# Patient Record
Sex: Male | Born: 1990 | Race: Black or African American | Hispanic: No | Marital: Single | Smoking: Former smoker
Health system: Southern US, Community
[De-identification: ages and names within clinical notes are randomized; demographics above are authoritative.]

## PROBLEM LIST (undated history)

## (undated) HISTORY — PX: CYST REMOVAL TRUNK: SHX6283

---
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
## 2013-01-13 ENCOUNTER — Encounter (HOSPITAL_COMMUNITY): Payer: Self-pay | Admitting: Emergency Medicine

## 2013-01-13 ENCOUNTER — Emergency Department (INDEPENDENT_AMBULATORY_CARE_PROVIDER_SITE_OTHER)
Admission: EM | Admit: 2013-01-13 | Discharge: 2013-01-13 | Disposition: A | Discharge status: Home / Self Care | Payer: Self-pay | Source: Home / Self Care | Attending: Family Medicine | Admitting: Family Medicine

## 2013-01-13 DIAGNOSIS — L02419 Cutaneous abscess of limb, unspecified: Secondary | ICD-10-CM

## 2013-01-13 DIAGNOSIS — L02416 Cutaneous abscess of left lower limb: Secondary | ICD-10-CM

## 2013-01-13 MED ORDER — SULFAMETHOXAZOLE-TRIMETHOPRIM 800-160 MG PO TABS: 1.0000 | ORAL_TABLET | Freq: Two times a day (BID) | ORAL | Status: DC

## 2013-01-13 NOTE — ED Notes (Signed)
C/o boil for two days.  No treatment done.

## 2013-01-13 NOTE — ED Provider Notes (Signed)
Mark Norton is a 22 y.o. male who presents to Urgent Care today for abscess left inner thigh present for several days worsening. He has not tried any medications or treatments. He feels well otherwise. It is painful and tender but not yet producing discharge. He feels well otherwise. He has not had anything like this before.    History reviewed. No pertinent past medical history. History  Substance Use Topics  . Smoking status: Not on file  . Smokeless tobacco: Not on file  . Alcohol Use: Not on file   ROS as above Medications reviewed. No current facility-administered medications for this encounter.   Current Outpatient Prescriptions  Medication Sig Dispense Refill  . ibuprofen (ADVIL,MOTRIN) 400 MG tablet Take 400 mg by mouth every 4 (four) hours as needed. For pain       . sulfamethoxazole-trimethoprim (SEPTRA DS) 800-160 MG per tablet Take 1 tablet by mouth 2 (two) times daily.  14 tablet  0  . [DISCONTINUED] fluticasone (FLONASE) 50 MCG/ACT nasal spray 1 spray daily. In each nostril         Exam:  BP 104/68  Pulse 64  Temp(Src) 97.5 F (36.4 C) (Oral)  Resp 18  SpO2 95% Gen: Well NAD SKIN: Area of fluctuance approximately 1 cm in circumference round by 2 x 3 cm area of induration and erythema on left inner thigh. Tender to touch  Abscess incision and drainage: Consent obtained and timeout performed. Area cleaned with alcohol and cold spray applied in 3 mL of lidocaine with epinephrine were used to achieve anesthesia. An 11 blade scalpel was used to incise the area of fluctuance and a moderate amount of pus was expressed and cultured.  A wooden Q-tip was used to break up loculations. The wound was packed with 2 inches of quarter inch packing material. Patient tolerated the procedure well.  Assessment and Plan: 22 y.o. male with abscess incision and drainage. Culture pending. Place patient on empiric antibiotics will use Septra. Followup as needed Remove packing  material in 3 days Discussed warning signs or symptoms. Please see discharge instructions. Patient expresses understanding.      Rodolph Bong, MD 01/13/13 1236

## 2013-01-14 ENCOUNTER — Encounter (HOSPITAL_COMMUNITY): Payer: Self-pay

## 2013-01-14 ENCOUNTER — Emergency Department (HOSPITAL_COMMUNITY)
Admission: EM | Admit: 2013-01-14 | Discharge: 2013-01-14 | Disposition: A | Discharge status: Home / Self Care | Payer: Self-pay | Attending: Emergency Medicine | Admitting: Emergency Medicine

## 2013-01-14 DIAGNOSIS — M549 Dorsalgia, unspecified: Secondary | ICD-10-CM | POA: Insufficient documentation

## 2013-01-14 DIAGNOSIS — L0291 Cutaneous abscess, unspecified: Secondary | ICD-10-CM

## 2013-01-14 DIAGNOSIS — R110 Nausea: Secondary | ICD-10-CM | POA: Insufficient documentation

## 2013-01-14 DIAGNOSIS — F17200 Nicotine dependence, unspecified, uncomplicated: Secondary | ICD-10-CM | POA: Insufficient documentation

## 2013-01-14 DIAGNOSIS — R51 Headache: Secondary | ICD-10-CM | POA: Insufficient documentation

## 2013-01-14 DIAGNOSIS — L02419 Cutaneous abscess of limb, unspecified: Secondary | ICD-10-CM | POA: Insufficient documentation

## 2013-01-14 MED ORDER — SULFAMETHOXAZOLE-TMP DS 800-160 MG PO TABS
2.0000 | ORAL_TABLET | Freq: Once | ORAL | Status: AC
  Administered 2013-01-14: 2 via ORAL
  Filled 2013-01-14: qty 2

## 2013-01-14 MED ORDER — CEPHALEXIN 250 MG PO CAPS
500.0000 mg | ORAL_CAPSULE | Freq: Once | ORAL | Status: AC
  Administered 2013-01-14: 500 mg via ORAL
  Filled 2013-01-14: qty 2

## 2013-01-14 MED ORDER — CEPHALEXIN 500 MG PO CAPS: 500.0000 mg | ORAL_CAPSULE | Freq: Four times a day (QID) | ORAL | Status: DC

## 2013-01-14 MED ORDER — IBUPROFEN 800 MG PO TABS: 800.0000 mg | ORAL_TABLET | Freq: Three times a day (TID) | ORAL | Status: DC | PRN

## 2013-01-14 MED ORDER — HYDROCODONE-ACETAMINOPHEN 5-325 MG PO TABS: 1.0000 | ORAL_TABLET | ORAL | Status: DC | PRN

## 2013-01-14 MED ORDER — HYDROCODONE-ACETAMINOPHEN 5-325 MG PO TABS
1.0000 | ORAL_TABLET | Freq: Once | ORAL | Status: AC
  Administered 2013-01-14: 1 via ORAL
  Filled 2013-01-14: qty 1

## 2013-01-14 MED ORDER — SULFAMETHOXAZOLE-TRIMETHOPRIM 800-160 MG PO TABS: 2.0000 | ORAL_TABLET | Freq: Two times a day (BID) | ORAL | Status: AC

## 2013-01-14 NOTE — ED Notes (Signed)
Pt presents with Left upper thigh, pt reports he was seen at [facility] yesterday, they lanced the area and packed it. Pt reports the packing has came out since then, area is bleeding and he is having increased pain.

## 2013-01-14 NOTE — ED Provider Notes (Signed)
Medical screening examination/treatment/procedure(s) were performed by non-physician practitioner and as supervising physician I was immediately available for consultation/collaboration.  Layla Maw Ward, DO 01/14/13 2337

## 2013-01-14 NOTE — ED Provider Notes (Signed)
CSN: [id number]     Arrival date & time 01/14/13  1732 History  This chart was scribed for Trixie Dredge, PA working with Raelyn Number, DO by Quintella Reichert, ED Scribe. This patient was seen in room TR08C/TR08C and the patient's care was started at 6:34 PM.  Chief Complaint  Patient presents with  . Abscess    The history is provided by the patient. No language interpreter was used.    HPI Comments: Mark Norton is a 21 y.o. male who presents to the Emergency Department complaining of a gradually-worsening, moderately-painful, non-draining abscess to the left upper thigh that has been present for 3 days.  Pain is exacerbated by sitting and walking.  Pt was seen at [facility] yesterday and had the area lanced and received a Septra prescription.  Today while he was cleaning the area some of his packing came out and the area began bleeding.  His pain to that area also became much more severe.  He has not taken any pain medications pta.  He states that he had some nausea yesterday after taking his antibiotics.  He also complains of back pain and headache that began today.  He denies fever, vomiting, diarrhea, testicular pain or swelling, or dysuria.   History reviewed. No pertinent past medical history.  History reviewed. No pertinent past surgical history.  History reviewed. No pertinent family history.  History  Substance Use Topics  . Smoking status: Current Every Day Smoker    Types: Cigarettes  . Smokeless tobacco: Not on file  . Alcohol Use: Yes     Review of Systems  Constitutional: Negative for fever.  Gastrointestinal: Positive for nausea (with antibiotics). Negative for vomiting and diarrhea.  Genitourinary: Negative for dysuria, scrotal swelling, difficulty urinating and testicular pain.  Musculoskeletal: Positive for back pain.  Skin:       Abscess  Neurological: Positive for headaches.     Allergies  Blueberry flavor  Home Medications   Current Outpatient Rx  Name   Route  Sig  Dispense  Refill  . sulfamethoxazole-trimethoprim (SEPTRA DS) 800-160 MG per tablet   Oral   Take 1 tablet by mouth 2 (two) times daily.   14 tablet   0    BP 98/60  Pulse 78  Temp(Src) 98.2 F (36.8 C) (Oral)  Resp 16  Ht 5\' 9"  (1.753 m)  Wt 157 lb 4.8 oz (71.351 kg)  BMI 23.22 kg/m2  SpO2 98%  Physical Exam  Nursing note and vitals reviewed. Constitutional: He appears well-developed and well-nourished. No distress.  HENT:  Head: Normocephalic and atraumatic.  Neck: Neck supple.  Pulmonary/Chest: Effort normal.  Abdominal: Soft. There is no tenderness. There is no rebound and no guarding.  Neurological: He is alert.  Skin: He is not diaphoretic. There is erythema.  Left superior medial thigh with area of induration, with central incision that is open with packing material in place.  Small area of erythema over the indurated area.  No active discharge.  Tender to palpation.    ED Course  Procedures (including critical care time)  DIAGNOSTIC STUDIES: Oxygen Saturation is 98% on room air, normal by my interpretation.    COORDINATION OF CARE: 6:39 PM-Discussed treatment plan which includes pain medication, continuing antibiotics treatment, and warm compresses with pt at bedside and pt agreed to plan.    Labs Review Labs Reviewed - No data to display  Imaging Review No results found.  MDM   1. Abscess    Pt  with abscess with localized erythema, seen at Urgent Care yesterday with I&D performed.  Packing is still in place, the area is not worse in appearance per patient.  He has no systemic symptoms.  He has only taken 1-2 doses of his bactrim.  No pain medication prescribed to him at the time, per patient.  Pt d/c home with increased dosage of bactrim per EMRA guidelines, +Keflex, + pain medication (norco and ibuprofen).  Pt to follow prior UC instructions regarding removing packing.  Pt using crutches from home, requests crutches from [place] for comfort given  pain with walking due to the area that is affected. Discussed findings, treatment, and follow up  with patient.  Pt given return precautions.  Pt verbalizes understanding and agrees with plan.        I personally performed the services described in this documentation, which was scribed in my presence. The recorded information has been reviewed and is accurate.    Trixie Dredge, PA-C 01/14/13 1911

## 2013-01-17 LAB — CULTURE, ROUTINE-ABSCESS

## 2013-01-17 NOTE — ED Notes (Signed)
No group B step or group staph Aureus

## 2013-01-17 NOTE — ED Notes (Signed)
[facility] lab review 

## 2013-10-23 ENCOUNTER — Encounter (HOSPITAL_COMMUNITY): Payer: Self-pay | Admitting: Emergency Medicine

## 2013-10-23 ENCOUNTER — Emergency Department (HOSPITAL_COMMUNITY): Payer: Self-pay

## 2013-10-23 ENCOUNTER — Emergency Department (HOSPITAL_COMMUNITY)
Admission: EM | Admit: 2013-10-23 | Discharge: 2013-10-23 | Disposition: A | Discharge status: Home / Self Care | Payer: Self-pay | Attending: Emergency Medicine | Admitting: Emergency Medicine

## 2013-10-23 DIAGNOSIS — Z23 Encounter for immunization: Secondary | ICD-10-CM | POA: Insufficient documentation

## 2013-10-23 DIAGNOSIS — R079 Chest pain, unspecified: Secondary | ICD-10-CM | POA: Insufficient documentation

## 2013-10-23 DIAGNOSIS — IMO0002 Reserved for concepts with insufficient information to code with codable children: Secondary | ICD-10-CM | POA: Insufficient documentation

## 2013-10-23 DIAGNOSIS — M79601 Pain in right arm: Secondary | ICD-10-CM

## 2013-10-23 DIAGNOSIS — T07XXXA Unspecified multiple injuries, initial encounter: Secondary | ICD-10-CM

## 2013-10-23 DIAGNOSIS — S0990XA Unspecified injury of head, initial encounter: Secondary | ICD-10-CM | POA: Insufficient documentation

## 2013-10-23 DIAGNOSIS — Y9389 Activity, other specified: Secondary | ICD-10-CM | POA: Insufficient documentation

## 2013-10-23 DIAGNOSIS — F17200 Nicotine dependence, unspecified, uncomplicated: Secondary | ICD-10-CM | POA: Insufficient documentation

## 2013-10-23 DIAGNOSIS — Y92410 Unspecified street and highway as the place of occurrence of the external cause: Secondary | ICD-10-CM | POA: Insufficient documentation

## 2013-10-23 MED ORDER — ORPHENADRINE CITRATE ER 100 MG PO TB12: 100.0000 mg | ORAL_TABLET | Freq: Two times a day (BID) | ORAL | Status: DC | PRN

## 2013-10-23 MED ORDER — TETANUS-DIPHTH-ACELL PERTUSSIS 5-2.5-18.5 LF-MCG/0.5 IM SUSP
0.5000 mL | Freq: Once | INTRAMUSCULAR | Status: AC
  Administered 2013-10-23: 0.5 mL via INTRAMUSCULAR
  Filled 2013-10-23: qty 0.5

## 2013-10-23 MED ORDER — HYDROCODONE-ACETAMINOPHEN 5-325 MG PO TABS
1.0000 | ORAL_TABLET | Freq: Once | ORAL | Status: AC
  Administered 2013-10-23: 1 via ORAL
  Filled 2013-10-23: qty 1

## 2013-10-23 NOTE — ED Notes (Signed)
Dr. Benito MccreedyMcLendon at the bedside talking with pt and pt's mother.
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
## 2013-10-23 NOTE — ED Notes (Signed)
Patient transported to X-ray, then CT 

## 2013-10-23 NOTE — ED Notes (Signed)
Mom at bedside.
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
## 2013-10-24 NOTE — ED Provider Notes (Signed)
I saw and evaluated the patient, reviewed the resident's note and I agree with the findings and plan.   EKG Interpretation   Date/Time:    Ventricular Rate:  79 PR Interval:  143 QRS Duration: 84 QT Interval:  356 QTC Calculation: 408 R Axis:   85 Text Interpretation:          Rolan BuccoMelanie Helios Kohlmann, MD 10/24/13 [phone]

## 2013-10-31 ENCOUNTER — Emergency Department (HOSPITAL_COMMUNITY): Payer: Self-pay

## 2013-10-31 ENCOUNTER — Encounter (HOSPITAL_COMMUNITY): Payer: Self-pay | Admitting: Emergency Medicine

## 2013-10-31 ENCOUNTER — Emergency Department (HOSPITAL_COMMUNITY)
Admission: EM | Admit: 2013-10-31 | Discharge: 2013-10-31 | Disposition: A | Discharge status: Home / Self Care | Payer: Self-pay | Attending: Emergency Medicine | Admitting: Emergency Medicine

## 2013-10-31 DIAGNOSIS — S1190XA Unspecified open wound of unspecified part of neck, initial encounter: Secondary | ICD-10-CM | POA: Insufficient documentation

## 2013-10-31 DIAGNOSIS — S21209A Unspecified open wound of unspecified back wall of thorax without penetration into thoracic cavity, initial encounter: Secondary | ICD-10-CM | POA: Insufficient documentation

## 2013-10-31 DIAGNOSIS — S0990XA Unspecified injury of head, initial encounter: Secondary | ICD-10-CM | POA: Insufficient documentation

## 2013-10-31 DIAGNOSIS — IMO0002 Reserved for concepts with insufficient information to code with codable children: Secondary | ICD-10-CM

## 2013-10-31 DIAGNOSIS — F17200 Nicotine dependence, unspecified, uncomplicated: Secondary | ICD-10-CM | POA: Insufficient documentation

## 2013-10-31 MED ORDER — ONDANSETRON HCL 4 MG/2ML IJ SOLN
4.0000 mg | Freq: Once | INTRAMUSCULAR | Status: AC
  Administered 2013-10-31: 4 mg via INTRAVENOUS
  Filled 2013-10-31: qty 2

## 2013-10-31 MED ORDER — HYDROMORPHONE HCL PF 1 MG/ML IJ SOLN
1.0000 mg | Freq: Once | INTRAMUSCULAR | Status: AC
  Administered 2013-10-31: 1 mg via INTRAVENOUS
  Filled 2013-10-31: qty 1

## 2013-10-31 NOTE — ED Notes (Signed)
Dr. Rancour at the bedside.  
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
## 2013-10-31 NOTE — ED Notes (Signed)
Pt getting dressed. Pt standing at bedside. Pt received dressings for staples to back of neck and back of shoulder.
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
## 2013-11-07 ENCOUNTER — Emergency Department (INDEPENDENT_AMBULATORY_CARE_PROVIDER_SITE_OTHER)
Admission: EM | Admit: 2013-11-07 | Discharge: 2013-11-07 | Disposition: A | Discharge status: Home / Self Care | Payer: Self-pay | Source: Home / Self Care

## 2013-11-07 ENCOUNTER — Encounter (HOSPITAL_COMMUNITY): Payer: Self-pay | Admitting: Emergency Medicine

## 2013-11-07 DIAGNOSIS — Z4802 Encounter for removal of sutures: Secondary | ICD-10-CM

## 2013-11-07 NOTE — ED Notes (Signed)
Pt here for staple from right side of neck and back.  Wounds appears well healed no signs of infection.

## 2013-11-07 NOTE — Discharge Instructions (Signed)
Staple Removal, Care After °The staples that were used to close your skin have been removed. The care described here will need to continue until the wound is completely healed and your health care provider confirms that wound care can be stopped. °HOME CARE INSTRUCTIONS  °· Keep the wound site dry and clean. Do not soak it in water. °· If skin adhesive strips were applied after the staples were removed, they will begin to peel off in a few days. Allow them to remain in place until they fall off on their own. °· If you still have a bandage (dressing), change it at least once a day or as directed by your health care provider. If the dressing sticks, pour warm, sterile water over it until it loosens and can be removed without pulling apart the wound edges. Pat dry with a clean towel. °· Apply cream or ointment that stops the growth of bacteria (antibacterial cream or ointment) only if your health care provider has directed you to do so. Place a nonstick bandage over the wound to prevent the dressing from sticking. °· Cover the nonstick bandage with a new dressing as directed by your health care provider. °· If the bandage becomes wet, dirty, or develops a bad smell, change it as soon as possible. °· New scars become sunburned easily. Use sunscreens with a sun protection factor (SPF) of at least 15 when out in the sun. Reapply the SPF every 2 hours. °· Only take medicines as directed by your health care provider. °SEEK IMMEDIATE MEDICAL CARE IF:  °· You have redness, swelling, or increasing pain in the wound. °· You have pus coming from the wound. °· You have a fever. °· You notice a bad smell coming from the wound or dressing. °· Your wound edges open up after staples have been removed. °MAKE SURE YOU:  °· Understand these instructions. °· Will watch your condition. °· Will get help right away if you are not doing well or get worse. °Document Released: 03/27/2008 Document Revised: 04/19/2013 Document Reviewed:  03/27/2008 °[employer]® Patient Information ©2015 [employer]. This information is not intended to replace advice given to you by your health care provider. Make sure you discuss any questions you have with your health care provider. ° °

## 2013-11-07 NOTE — ED Provider Notes (Signed)
CSN: [id number]     Arrival date & time 11/07/13  1104 History   First MD Initiated Contact with Patient 11/07/13 1131     Chief Complaint  Patient presents with  . Suture / Staple Removal   (Consider location/radiation/quality/duration/timing/severity/associated sxs/prior Treatment) HPI Comments: For staple removal after repair laceration to posterior neck and R back.  6 staples R back and 5 post neck.   History reviewed. No pertinent past medical history. History reviewed. No pertinent past surgical history. History reviewed. No pertinent family history. History  Substance Use Topics  . Smoking status: Current Every Day Smoker    Types: Cigarettes  . Smokeless tobacco: Not on file  . Alcohol Use: Yes    Review of Systems  All other systems reviewed and are negative.   Allergies  Blueberry flavor  Home Medications   Prior to Admission medications   Not on File   BP 106/64  Pulse 74  Temp(Src) 98.3 F (36.8 C) (Oral)  Resp 14  SpO2 98% Physical Exam  Nursing note and vitals reviewed. Constitutional: He is oriented to person, place, and time. He appears well-developed and well-nourished. No distress.  Neurological: He is alert and oriented to person, place, and time.  Skin: Skin is warm and dry.  Wounds healing well. Edges closed and well approximated. No signs of infection, drainage or bleeding    ED Course  SUTURE REMOVAL Date/Time: 11/07/2013 11:43 AM Performed by: Phineas RealMABE, Browning [place] Authorized by: Leslee HomeKELLER, Abdulmalik Darco C Consent: Verbal consent obtained. Risks and benefits: risks, benefits and alternatives were discussed Consent given by: patient Patient understanding: patient states understanding of the procedure being performed Patient identity confirmed: verbally with patient Body area: trunk Location details: back Wound Appearance: clean Staples Removed: 6 Patient tolerance: Patient tolerated the procedure well with no immediate complications.    #2 Post neck  staples #5 removed. Healing well with no signs of infection. Clean.  Labs Review Labs Reviewed - No data to display  Imaging Review No results found.   MDM   1. Encounter for staple removal    All 11 staples removed    Hayden Rasmussenavid Thelda Gagan, NP 11/07/13 1144  Hayden Rasmussenavid Pearlee Arvizu, NP 11/07/13 1146

## 2013-11-08 NOTE — ED Provider Notes (Signed)
Medical screening examination/treatment/procedure(s) were performed by non-physician practitioner and as supervising physician I was immediately available for consultation/collaboration.  Leslee Homeavid Geana Walts, M.D.  Reuben Likesavid C Fender Herder, MD 11/08/13 [phone]

## 2015-09-15 ENCOUNTER — Emergency Department (HOSPITAL_COMMUNITY)
Admission: EM | Admit: 2015-09-15 | Discharge: 2015-09-15 | Disposition: A | Discharge status: Home / Self Care | Payer: Self-pay | Attending: Emergency Medicine | Admitting: Emergency Medicine

## 2015-09-15 ENCOUNTER — Encounter (HOSPITAL_COMMUNITY): Payer: Self-pay

## 2015-09-15 DIAGNOSIS — F17210 Nicotine dependence, cigarettes, uncomplicated: Secondary | ICD-10-CM | POA: Insufficient documentation

## 2015-09-15 DIAGNOSIS — J029 Acute pharyngitis, unspecified: Secondary | ICD-10-CM | POA: Insufficient documentation

## 2015-09-15 DIAGNOSIS — R059 Cough, unspecified: Secondary | ICD-10-CM

## 2015-09-15 DIAGNOSIS — R05 Cough: Secondary | ICD-10-CM

## 2015-09-15 LAB — RAPID STREP SCREEN (MED CTR MEBANE ONLY): STREPTOCOCCUS, GROUP A SCREEN (DIRECT): NEGATIVE

## 2015-09-15 MED ORDER — ACETAMINOPHEN 325 MG PO TABS
650.0000 mg | ORAL_TABLET | Freq: Once | ORAL | Status: AC
  Administered 2015-09-15: 650 mg via ORAL
  Filled 2015-09-15: qty 2

## 2015-09-15 MED ORDER — PHENOL 1.4 % MT LIQD: 1.0000 | OROMUCOSAL | Status: DC | PRN

## 2015-09-15 NOTE — ED Notes (Addendum)
Patient here with sore throat and cough x 2 days, no distress. Also request to be checked for STD, reports no symptoms

## 2015-09-15 NOTE — ED Provider Notes (Signed)
CSN: [id number]     Arrival date & time 09/15/15  0727 History   First MD Initiated Contact with Patient 09/15/15 [phone]     Chief Complaint  Patient presents with  . Sore Throat  . Cough     (Consider location/radiation/quality/duration/timing/severity/associated sxs/prior Treatment) Patient is a 25 y.o. male presenting with pharyngitis and cough. The history is provided by the patient and medical records.  Sore Throat Associated symptoms include coughing and a sore throat.  Cough Associated symptoms: sore throat    24 y.o. M here with sore throat x 4 days.  Denies fever, chills, sweats.  States when he eats/drinks it burns from his throat all the way into his chest.  Also reports dry cough.  No sick contacts noted.  Denies chest pain or SOB.  Patient is a regular smoker.  No intervention tried PTA.  History reviewed. No pertinent past medical history. History reviewed. No pertinent past surgical history. No family history on file. Social History  Substance Use Topics  . Smoking status: Current Every Day Smoker    Types: Cigarettes  . Smokeless tobacco: None  . Alcohol Use: Yes    Review of Systems  HENT: Positive for sore throat.   Respiratory: Positive for cough.   All other systems reviewed and are negative.     Allergies  Blueberry flavor  Home Medications   Prior to Admission medications   Not on File   BP 117/67 mmHg  Pulse 101  Temp(Src) 99 F (37.2 C) (Oral)  Resp 18  SpO2 100%   Physical Exam  Constitutional: He is oriented to person, place, and time. He appears well-developed and well-nourished. No distress.  Lying in bed with blanket over this head, very uncooperative with exam  HENT:  Head: Normocephalic and atraumatic.  Right Ear: Tympanic membrane and ear canal normal.  Left Ear: Tympanic membrane and ear canal normal.  Nose: Nose normal.  Mouth/Throat: Uvula is midline, oropharynx is clear and moist and mucous membranes are normal. No  oropharyngeal exudate, posterior oropharyngeal edema, posterior oropharyngeal erythema or tonsillar abscesses.  Tonsils overall normal in appearance bilaterally without exudate; uvula midline without evidence of peritonsillar abscess; handling secretions appropriately; no difficulty swallowing or speaking; normal phonation without stridor  Eyes: Conjunctivae and EOM are normal. Pupils are equal, round, and reactive to light.  Neck: Normal range of motion. Neck supple.  Cardiovascular: Normal rate, regular rhythm and normal heart sounds.   Pulmonary/Chest: Effort normal and breath sounds normal. No respiratory distress. He has no wheezes. He has no rhonchi.  Lungs clear, no wheezes or rhonchi, no distress  Abdominal: Soft. Bowel sounds are normal. There is no tenderness. There is no guarding.  Musculoskeletal: Normal range of motion.  Neurological: He is alert and oriented to person, place, and time.  Skin: Skin is warm and dry. He is not diaphoretic.  Psychiatric: He has a normal mood and affect.  Nursing note and vitals reviewed.   ED Course  Procedures (including critical care time) Labs Review Labs Reviewed  RAPID STREP SCREEN (NOT AT [facility])  CULTURE, GROUP A STREP [facility])    Imaging Review No results found. I have personally reviewed and evaluated these images and lab results as part of my medical decision-making.   EKG Interpretation None      MDM   Final diagnoses:  Sore throat  Cough   24 y.o. M here with URI symptoms x 4 days.  Patient afebrile, non-toxic.  Exam is overall benign.  Lungs clear without wheezes or rhonchi to suggest pneumonia.  Rapid strep test negative, culture pending.  Feel his symptoms are likely viral in nature.  D/c home with symptomatic care.  Discussed plan with patient, he/she acknowledged understanding and agreed with plan of care.  Return precautions given for new or worsening symptoms.  8:24 AM After patient was discharged he advised RN that  he wanted to be screened for STDs.  I have personally gone into patient's room 3x during the course of his ED visit, this was never mentioned to me or RN.  Patient denies symptoms, just states he wants to be screened.  Patient advised to follow-up with health dept for this and given information in which to do so.  Garlon Hatchet, PA-C 09/15/15 1610  Mancel Bale, MD 09/15/15 2020

## 2015-09-15 NOTE — Discharge Instructions (Signed)
May take tylenol or motrin as needed for pain. May also use chloraseptic spray. Follow-up with your doctor.  If you do not have one, call number on paperwork to find one in the area. Return here for new concerns.

## 2015-09-15 NOTE — ED Notes (Signed)
Declined W/C at D/C and was escorted to lobby by RN. 

## 2015-09-17 LAB — CULTURE, GROUP A STREP (THRC)
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
## 2015-12-05 ENCOUNTER — Emergency Department (HOSPITAL_COMMUNITY)
Admission: EM | Admit: 2015-12-05 | Discharge: 2015-12-05 | Disposition: A | Discharge status: Home / Self Care | Payer: Self-pay | Attending: Emergency Medicine | Admitting: Emergency Medicine

## 2015-12-05 ENCOUNTER — Encounter (HOSPITAL_COMMUNITY): Payer: Self-pay | Admitting: *Deleted

## 2015-12-05 DIAGNOSIS — Y929 Unspecified place or not applicable: Secondary | ICD-10-CM | POA: Insufficient documentation

## 2015-12-05 DIAGNOSIS — S60561A Insect bite (nonvenomous) of right hand, initial encounter: Secondary | ICD-10-CM | POA: Insufficient documentation

## 2015-12-05 DIAGNOSIS — Y939 Activity, unspecified: Secondary | ICD-10-CM | POA: Insufficient documentation

## 2015-12-05 DIAGNOSIS — W57XXXA Bitten or stung by nonvenomous insect and other nonvenomous arthropods, initial encounter: Secondary | ICD-10-CM | POA: Insufficient documentation

## 2015-12-05 DIAGNOSIS — Y990 Civilian activity done for income or pay: Secondary | ICD-10-CM | POA: Insufficient documentation

## 2015-12-05 DIAGNOSIS — F17210 Nicotine dependence, cigarettes, uncomplicated: Secondary | ICD-10-CM | POA: Insufficient documentation

## 2015-12-05 MED ORDER — DIPHENHYDRAMINE HCL 25 MG PO TABS: 50.0000 mg | ORAL_TABLET | Freq: Four times a day (QID) | ORAL | 0 refills | Status: DC | PRN

## 2015-12-05 MED ORDER — SULFAMETHOXAZOLE-TRIMETHOPRIM 800-160 MG PO TABS: 1.0000 | ORAL_TABLET | Freq: Two times a day (BID) | ORAL | 0 refills | Status: AC

## 2015-12-05 MED ORDER — HYDROCORTISONE 1 % EX CREA: TOPICAL_CREAM | CUTANEOUS | 0 refills | Status: DC

## 2015-12-05 NOTE — Discharge Instructions (Signed)
Take tylenol and ibuprofen for pain. Return for worsening symptoms, including worsening swelling/redness/pain, fevers, or any other symptoms concerning to you.

## 2015-12-05 NOTE — ED Provider Notes (Signed)
[facility] Provider Note   CSN: [id number] Arrival date & time: 12/05/15  0241  First Provider Contact:  None       History   Chief Complaint Chief Complaint  Patient presents with  . Insect Bite    HPI Mark Norton is a 25 y.o. male.  HPI  25 year old male who presents with insect bite to the right hand. 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 Has had itchiness to area and noticed a white head subsequently over area. Occurred earlier today. With increased redness, swelling, pain and itching. No injury to hand. No difficulty breathing, swelling or throat or mouth, chest pain, n/v, abdominal pain.  History reviewed. No pertinent past medical history.  Patient Active Problem List   Diagnosis Date Noted  . CONTACT DERMATITIS&OTHER ECZEMA DUE UNSPEC CAUSE 01/05/2008  . 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 SCOLIOSIS, IDIOPATHIC 08/04/2006  . ATTENTION DEFICIT, W/HYPERACTIVITY 06/25/2006  . RHINITIS, ALLERGIC 06/25/2006    History reviewed. No pertinent surgical history.     Home Medications    Prior to Admission medications   Medication Sig Start Date End Date Taking? Authorizing Provider  diphenhydrAMINE (BENADRYL) 25 MG tablet Take 2 tablets (50 mg total) by mouth every 6 (six) hours as needed for itching. 12/05/15   Lavera Guise, MD  hydrocortisone cream 1 % Apply to affected area 2 times daily 12/05/15   Lavera Guise, MD  phenol (CHLORASEPTIC) 1.4 % LIQD Use as directed 1 spray in the mouth or throat as needed for throat irritation / pain. 09/15/15   Garlon Hatchet, PA-C  sulfamethoxazole-trimethoprim (BACTRIM DS,SEPTRA DS) 800-160 MG tablet Take 1 tablet by mouth 2 (two) times daily. 12/05/15 12/12/15  Lavera Guise, MD    Family History No family history on file.  Social History Social History  Substance Use  Topics  . Smoking status: Current Every Day Smoker    Types: Cigarettes  . Smokeless tobacco: Never Used  . Alcohol use Yes     Allergies   Blueberry flavor   Review of Systems Review of Systems  Constitutional: Negative for fever.  HENT: Negative.   Respiratory: Negative for shortness of breath.   Cardiovascular: Negative for chest pain.  Gastrointestinal: Negative for nausea and vomiting.  All other systems reviewed and are negative.    Physical Exam Updated Vital Signs BP 104/68   Pulse (!) 49   Temp 98.2 F (36.8 C) (Oral)   Resp 16   Ht  (1.753 m)   Wt 177 lb 2 oz (80.3 kg)   SpO2 99%   BMI 26.16 kg/m   Physical Exam Physical Exam  Nursing note and vitals reviewed. Constitutional: Well developed, well nourished, non-toxic, and in no acute distress Head: Normocephalic and atraumatic.  Mouth/Throat: Oropharynx is clear and moist.  Neck:. Neck supple.  Cardiovascular: Normal rate and regular rhythm.  +2 radial pulses Pulmonary/Chest: Effort normal  Abdominal: Soft. There is no tenderness. There is no rebound and no guarding.  Musculoskeletal: No deformities Neurological: Alert, no facial droop, fluent speech, moves all extremities symmetrically Skin: Skin is warm and dry.  Psychiatric: Cooperative   ED Treatments / Results  Labs (all labs ordered are listed, but only abnormal results are displayed) Labs Reviewed - No data to display  EKG  EKG Interpretation  None       Radiology No results found.  Procedures Procedures (including critical care time)  Medications Ordered in ED Medications - No data to display   Initial Impression / Assessment and Plan / ED Course  I have reviewed the triage vital signs and the nursing notes.  Pertinent labs & imaging results that were available during my care of the patient were reviewed by me and considered in my medical decision making (see chart for details).  Clinical Course    25 year old  male with increased pain, swelling, redness to hand after potential insect bite. Well appearing and in no acute distress. VS nonconcerning. Extremity NV in tact. There is redness, swelling, slight warm to dorsum of the left hand between interweb of right thumb and index finger with pustule overlying. Seems too early for cellulitis, but possibility. Small patch of urticaria at the right wrist as well, likely from local allergic reaction to bite. Discussed benadryl and hydrocortisone cream. Will give trial of bactrim. Strict return and follow-up instructions reviewed. She/He expressed understanding of all discharge instructions and felt comfortable with the plan of care.   Final Clinical Impressions(s) / ED Diagnoses   Final diagnoses:  Insect bite    New Prescriptions Discharge Medication List as of 12/05/2015  5:13 AM    START taking these medications   Details  diphenhydrAMINE (BENADRYL) 25 MG tablet Take 2 tablets (50 mg total) by mouth every 6 (six) hours as needed for itching., Starting Wed 12/05/2015, Print    hydrocortisone cream 1 % Apply to affected area 2 times daily, Print    sulfamethoxazole-trimethoprim (BACTRIM DS,SEPTRA DS) 800-160 MG tablet Take 1 tablet by mouth 2 (two) times daily., Starting Wed 12/05/2015, Until Wed 12/12/2015, Print         Lavera Guiseana Duo Telena Peyser, MD 12/05/15 [phone]

## 2015-12-05 NOTE — ED Notes (Signed)
Pt unable to sign discharge instructions however signature pad is not working, verbalized understanding of discharge instructions and prescriptions.

## 2015-12-05 NOTE — ED Triage Notes (Signed)
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

## 2015-12-05 NOTE — ED Notes (Signed)
See triage note I triaged this pt earlier  No change

## 2016-01-28 ENCOUNTER — Ambulatory Visit (HOSPITAL_COMMUNITY)
Admission: EM | Admit: 2016-01-28 | Discharge: 2016-01-28 | Disposition: A | Discharge status: Home / Self Care | Payer: Self-pay | Attending: Internal Medicine | Admitting: Internal Medicine

## 2016-01-28 ENCOUNTER — Encounter (HOSPITAL_COMMUNITY): Payer: Self-pay | Admitting: Emergency Medicine

## 2016-01-28 DIAGNOSIS — S61412A Laceration without foreign body of left hand, initial encounter: Secondary | ICD-10-CM

## 2016-01-28 DIAGNOSIS — S61011A Laceration without foreign body of right thumb without damage to nail, initial encounter: Secondary | ICD-10-CM

## 2016-01-28 MED ORDER — BACITRACIN ZINC 500 UNIT/GM EX OINT
TOPICAL_OINTMENT | CUTANEOUS | Status: AC
  Filled 2016-01-28: qty 9

## 2016-01-28 MED ORDER — CEPHALEXIN 500 MG PO CAPS: 500.0000 mg | ORAL_CAPSULE | Freq: Four times a day (QID) | ORAL | 0 refills | Status: DC

## 2016-01-28 NOTE — ED Triage Notes (Signed)
[facility] a [facility] of a laceration to his right hand just below his thumb that occurred today while doing yard work.

## 2016-01-28 NOTE — ED Notes (Signed)
Ice pack given to pt.

## 2016-01-28 NOTE — ED Provider Notes (Signed)
[facility]    CSN: [id number] Arrival date & time: 01/28/16  1552     History   Chief Complaint Chief Complaint  Patient presents with  . Laceration    HPI Mark Norton is a 25 y.o. male.   Patient complains of laceration to right lateral thumb with razor wire.  Cut thru a glove.  Endorses full ROM.        History reviewed. No pertinent past medical history.  Patient Active Problem List   Diagnosis Date Noted  . CONTACT DERMATITIS&OTHER ECZEMA DUE UNSPEC CAUSE 01/05/2008  . 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 SCOLIOSIS, IDIOPATHIC 08/04/2006  . ATTENTION DEFICIT, W/HYPERACTIVITY 06/25/2006  . RHINITIS, ALLERGIC 06/25/2006    History reviewed. No pertinent surgical history.     Home Medications    Prior to Admission medications   Medication Sig Start Date End Date Taking? Authorizing Provider  cephALEXin (KEFLEX) 500 MG capsule Take 1 capsule (500 mg total) by mouth 4 (four) times daily. 01/28/16   Arnaldo NatalMichael S Alejandria Wessells, MD  diphenhydrAMINE (BENADRYL) 25 MG tablet Take 2 tablets (50 mg total) by mouth every 6 (six) hours as needed for itching. 12/05/15   Lavera Guiseana Duo Liu, MD  hydrocortisone cream 1 % Apply to affected area 2 times daily 12/05/15   Lavera Guiseana Duo Liu, MD  phenol (CHLORASEPTIC) 1.4 % LIQD Use as directed 1 spray in the mouth or throat as needed for throat irritation / pain. 09/15/15   Garlon HatchetLisa M Sanders, PA-C    Family History History reviewed. No pertinent family history.  Social History Social History  Substance Use Topics  . Smoking status: Current Every Day Smoker    Types: Cigarettes  . Smokeless tobacco: Never Used  . Alcohol use Yes     Allergies   Blueberry flavor   Review of Systems Review of Systems  Constitutional: Negative for chills and fever.  HENT: Negative for ear pain and sore throat.   Eyes: Negative for pain and visual disturbance.  Respiratory: Negative for cough and shortness of breath.   Cardiovascular: Negative  for chest pain and palpitations.  Gastrointestinal: Negative for abdominal pain and vomiting.  Genitourinary: Negative for dysuria and hematuria.  Musculoskeletal: Negative for arthralgias and back pain.  Skin: Negative for color change and rash.  Neurological: Negative for seizures and syncope.  All other systems reviewed and are negative.    Physical Exam Triage Vital Signs ED Triage Vitals  Enc Vitals Group     BP 01/28/16 1629 100/57     Pulse Rate 01/28/16 1629 60     Resp 01/28/16 1629 14     Temp 01/28/16 1629 98.2 F (36.8 C)     Temp Source 01/28/16 1629 Oral     SpO2 01/28/16 1629 99 %     Weight --      Height --      Head Circumference --      Peak Flow --      Pain Score 01/28/16 1708 6     Pain Loc --      Pain Edu? --      Excl. in GC? --    No data found.   Updated Vital Signs BP 100/57 (BP Location: Left Arm)   Pulse 60   Temp 98.2 F (36.8 C) (Oral)   Resp 14   SpO2 99%   Visual Acuity Right Eye Distance:   Left Eye Distance:   Bilateral Distance:    Right Eye Near:  Left Eye Near:    Bilateral Near:     Physical Exam  Constitutional: He is oriented to person, place, and time. He appears well-developed and well-nourished. No distress.  HENT:  Head: Normocephalic and atraumatic.  Mouth/Throat: Oropharynx is clear and moist.  Eyes: Conjunctivae and EOM are normal. Pupils are equal, round, and reactive to light. No scleral icterus.  Neck: Normal range of motion. Neck supple. No JVD present. No tracheal deviation present. No thyromegaly present.  Cardiovascular: Normal rate, regular rhythm and normal heart sounds.  Exam reveals no gallop and no friction rub.   No murmur heard. Pulmonary/Chest: Effort normal and breath sounds normal. No respiratory distress.  Abdominal: Soft. Bowel sounds are normal. He exhibits no distension. There is no tenderness.  Musculoskeletal: Normal range of motion. He exhibits no edema.  Lymphadenopathy:    He has  no cervical adenopathy.  Neurological: He is alert and oriented to person, place, and time. No cranial nerve deficit.  Skin: Skin is warm and dry. No rash noted. No erythema.  2cm clean laceration to right lateral thumb  Psychiatric: He has a normal mood and affect. His behavior is normal. Judgment and thought content normal.     UC Treatments / Results  Labs (all labs ordered are listed, but only abnormal results are displayed) Labs Reviewed - No data to display  EKG  EKG Interpretation None       Radiology No results found.  Procedures .Marland KitchenLaceration Repair Date/Time: 01/28/2016 7:05 PM Performed by: Arnaldo Natal Authorized by: Arnaldo Natal   Consent:    Consent obtained:  Verbal   Consent given by:  Patient   Risks discussed:  Infection and pain Anesthesia (see MAR for exact dosages):    Anesthesia method:  Local infiltration   Local anesthetic:  Lidocaine 2% w/o epi Laceration details:    Location:  Hand   Hand location:  R hand, dorsum   Length (cm):  2 Repair type:    Repair type:  Simple Pre-procedure details:    Preparation:  Patient was prepped and draped in usual sterile fashion Exploration:    Hemostasis achieved with:  Direct pressure   Wound exploration: wound explored through full range of motion     Contaminated: no   Treatment:    Area cleansed with:  Saline and Betadine   Amount of cleaning:  Standard   Irrigation solution:  Sterile saline   Irrigation method:  Syringe   Visualized foreign bodies/material removed: no   Skin repair:    Repair method:  Sutures   Suture size:  3-0   Suture material:  Prolene   Suture technique:  Simple interrupted Approximation:    Approximation:  Close   Vermilion border: well-aligned   Post-procedure details:    Dressing:  Antibiotic ointment and adhesive bandage   Patient tolerance of procedure:  Tolerated well, no immediate complications   (including critical care time)  Medications  Ordered in UC Medications - No data to display   Initial Impression / Assessment and Plan / UC Course  I have reviewed the triage vital signs and the nursing notes.  Pertinent labs & imaging results that were available during my care of the patient were reviewed by me and considered in my medical decision making (see chart for details).  Clinical Course    Simple wound closure; tolerated well. No complications.    Final Clinical Impressions(s) / UC Diagnoses   Final diagnoses:  Laceration of left hand without  foreign body, initial encounter    New Prescriptions Discharge Medication List as of 01/28/2016  6:32 PM    START taking these medications   Details  cephALEXin (KEFLEX) 500 MG capsule Take 1 capsule (500 mg total) by mouth 4 (four) times daily., Starting Mon 01/28/2016, Normal         Arnaldo Natal, MD 01/28/16 1910

## 2016-04-30 ENCOUNTER — Encounter (HOSPITAL_COMMUNITY): Payer: Self-pay

## 2016-04-30 ENCOUNTER — Emergency Department (HOSPITAL_COMMUNITY)
Admission: EM | Admit: 2016-04-30 | Discharge: 2016-04-30 | Disposition: A | Discharge status: Home / Self Care | Payer: Self-pay | Attending: Emergency Medicine | Admitting: Emergency Medicine

## 2016-04-30 DIAGNOSIS — Z202 Contact with and (suspected) exposure to infections with a predominantly sexual mode of transmission: Secondary | ICD-10-CM | POA: Insufficient documentation

## 2016-04-30 DIAGNOSIS — F17210 Nicotine dependence, cigarettes, uncomplicated: Secondary | ICD-10-CM | POA: Insufficient documentation

## 2016-04-30 MED ORDER — AZITHROMYCIN 250 MG PO TABS
1000.0000 mg | ORAL_TABLET | Freq: Once | ORAL | Status: AC
  Administered 2016-04-30: 1000 mg via ORAL
  Filled 2016-04-30: qty 4

## 2016-04-30 MED ORDER — LIDOCAINE HCL (PF) 1 % IJ SOLN
2.0000 mL | Freq: Once | INTRAMUSCULAR | Status: AC
  Administered 2016-04-30: 2 mL
  Filled 2016-04-30: qty 5

## 2016-04-30 MED ORDER — CEFTRIAXONE SODIUM 250 MG IJ SOLR
250.0000 mg | Freq: Once | INTRAMUSCULAR | Status: AC
  Administered 2016-04-30: 250 mg via INTRAMUSCULAR
  Filled 2016-04-30: qty 250

## 2016-04-30 NOTE — ED Triage Notes (Signed)
Pt. Has been exposed to STD and would like to be checked and treated.    Pt. Is having pain groin.  Pt. Denies any discharge

## 2016-04-30 NOTE — ED Notes (Signed)
Video visit coupon number and handout given to patient on discharge

## 2016-04-30 NOTE — Discharge Instructions (Signed)
You were treated today for possible gonorrhea and chlamydia. Your syphilis, HIV, gonorrhea and chlamydia cultures are pending. We will call you if they've returned abnormal. Always use protection when having intercourse. Follow-up with primary care doctor or health department next time for your testing.

## 2016-04-30 NOTE — ED Provider Notes (Signed)
[facility]EMERGENCY DEPT Provider Note   CSN: [id number] Arrival date & time: 04/30/16  1356  By signing my name below, I, Majel HomerPeyton Lee, attest that this documentation has been prepared under the direction and in the presence of Lulamae Skorupski, PA-C . Electronically Signed: Majel HomerPeyton Lee, Scribe. 04/30/2016. 3:49 PM.  History   Chief Complaint Chief Complaint  Patient presents with  . SEXUALLY TRANSMITTED DISEASE   The history is provided by the patient. No language interpreter was used.   HPI Comments: Mark Norton is a 26 y.o. male who presents to the Emergency Department for possible exposure to a STD. Pt reports his girlfriend was recently evaluated and diagnosed with gonorrhea. He notes he has "not had any symptoms but wanted to get treated just in case." He endorses having unprotected intercourse within the past 6 months. Pt denies penile discharge and penile pain. No urinary symptoms.  History reviewed. No pertinent past medical history.  Patient Active Problem List   Diagnosis Date Noted  . CONTACT DERMATITIS&OTHER ECZEMA DUE UNSPEC CAUSE 01/05/2008  . 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 SCOLIOSIS, IDIOPATHIC 08/04/2006  . ATTENTION DEFICIT, W/HYPERACTIVITY 06/25/2006  . RHINITIS, ALLERGIC 06/25/2006   No past surgical history on file.  Home Medications    Prior to Admission medications   Medication Sig Start Date End Date Taking? Authorizing Provider  cephALEXin (KEFLEX) 500 MG capsule Take 1 capsule (500 mg total) by mouth 4 (four) times daily. 01/28/16   Arnaldo NatalMichael S Diamond, MD  diphenhydrAMINE (BENADRYL) 25 MG tablet Take 2 tablets (50 mg total) by mouth every 6 (six) hours as needed for itching. 12/05/15   Lavera Guiseana Duo Liu, MD  hydrocortisone cream 1 % Apply to affected area 2 times daily 12/05/15   Lavera Guiseana Duo Liu, MD  phenol (CHLORASEPTIC) 1.4 % LIQD Use as directed 1 spray in the mouth or throat as needed for throat irritation / pain. 09/15/15   Garlon HatchetLisa M Sanders, PA-C    Family  History No family history on file.  Social History Social History  Substance Use Topics  . Smoking status: Current Every Day Smoker    Types: Cigarettes  . Smokeless tobacco: Never Used  . Alcohol use Yes     Allergies   Blueberry flavor   Review of Systems Review of Systems  Constitutional: Negative for fever.  Genitourinary: Negative for difficulty urinating, discharge, dysuria, penile pain, scrotal swelling and testicular pain.   Physical Exam Updated Vital Signs BP 114/56 (BP Location: Left Arm)   Pulse (!) 53   Temp 98.4 F (36.9 C) (Oral)   Resp 18   Ht 5\' 8"  (1.727 m)   Wt 185 lb (83.9 kg)   SpO2 100%   BMI 28.13 kg/m   Physical Exam  Constitutional: He is oriented to person, place, and time. He appears well-developed and well-nourished.  HENT:  Head: Normocephalic.  Eyes: EOM are normal.  Neck: Normal range of motion.  Pulmonary/Chest: Effort normal.  Abdominal: He exhibits no distension.  Genitourinary: Penis normal. No penile tenderness.  Musculoskeletal: Normal range of motion.  Neurological: He is alert and oriented to person, place, and time.  Psychiatric: He has a normal mood and affect.  Nursing note and vitals reviewed.  ED Treatments / Results  Labs (all labs ordered are listed, but only abnormal results are displayed) Labs Reviewed - No data to display  EKG  EKG Interpretation None       Radiology No results found.  Procedures Procedures (including critical care  time)  Medications Ordered in ED Medications - No data to display  DIAGNOSTIC STUDIES:  Oxygen Saturation is 100% on RA, normal by my interpretation.    COORDINATION OF CARE:  3:45 PM Discussed treatment plan with pt at bedside and pt agreed to plan.  Initial Impression / Assessment and Plan / ED Course  I have reviewed the triage vital signs and the nursing notes.  Pertinent labs & imaging results that were available during my care of the patient were reviewed  by me and considered in my medical decision making (see chart for details).  Clinical Course   Patient emergency department after exposure to gonorrhea, he denies any symptoms but requests testing and treatment. Gonorrhea, chlamydia, HIV, syphilis test obtained. He was treated with Rocephin 250 mg IM and Zithromax 1 g by mouth. Safe sex practices discussed. Will discharge home with outpatient follow-up.   Vitals:   04/30/16 1449  BP: 114/56  Pulse: (!) 53  Resp: 18  Temp: 98.4 F (36.9 C)  TempSrc: Oral  SpO2: 100%  Weight: 83.9 kg  Height: 5\' 8"  (1.727 m)    I personally performed the services described in this documentation, which was scribed in my presence. The recorded information has been reviewed and is accurate.  Final Clinical Impressions(s) / ED Diagnoses   Final diagnoses:  STD exposure    New Prescriptions New Prescriptions   No medications on file     Jaynie Crumble, PA-C 04/30/16 1625    Margarita Grizzle, MD 04/30/16 2044

## 2016-04-30 NOTE — ED Notes (Signed)
Tatyana PA at bedside   

## 2016-05-01 LAB — GC/CHLAMYDIA PROBE AMP (~~LOC~~) NOT AT ARMC
CHLAMYDIA, DNA PROBE: NEGATIVE
Neisseria Gonorrhea: NEGATIVE

## 2016-05-06 ENCOUNTER — Encounter (HOSPITAL_COMMUNITY): Payer: Self-pay | Admitting: Emergency Medicine

## 2016-05-06 ENCOUNTER — Emergency Department (HOSPITAL_COMMUNITY)
Admission: EM | Admit: 2016-05-06 | Discharge: 2016-05-06 | Disposition: A | Discharge status: Home / Self Care | Payer: Self-pay | Attending: Emergency Medicine | Admitting: Emergency Medicine

## 2016-05-06 DIAGNOSIS — F17210 Nicotine dependence, cigarettes, uncomplicated: Secondary | ICD-10-CM | POA: Insufficient documentation

## 2016-05-06 DIAGNOSIS — L729 Follicular cyst of the skin and subcutaneous tissue, unspecified: Secondary | ICD-10-CM | POA: Insufficient documentation

## 2016-05-06 MED ORDER — NAPROXEN 250 MG PO TABS
500.0000 mg | ORAL_TABLET | Freq: Once | ORAL | Status: AC
  Administered 2016-05-06: 500 mg via ORAL
  Filled 2016-05-06: qty 2

## 2016-05-06 MED ORDER — NAPROXEN 375 MG PO TABS: 375.0000 mg | ORAL_TABLET | Freq: Two times a day (BID) | ORAL | 0 refills | Status: DC

## 2016-05-06 NOTE — ED Provider Notes (Signed)
[facility] Provider Note   CSN: [id number] Arrival date & time: 05/06/16  1013  By signing my name below, I, Majel Homer, attest that this documentation has been prepared under the direction and in the presence of Demetrios Loll, PA-C. Electronically Signed: Majel Homer, Scribe. 05/06/2016. 12:42 PM.  History   Chief Complaint Chief Complaint  Patient presents with  . Abscess   The history is provided by the patient. No language interpreter was used.   HPI Comments: Mark Norton is a 26 y.o. male who presents to the Emergency Department complaining of gradually worsening, "knot" to his left forearm that he noticed last night. Pt reports his "knot" has increased in size today. He notes associated nausea that began last night. He states he has not taken any medication to relieve his pain. He denies fever, Upper extremity weakness or paresthesias, or edema.  History reviewed. No pertinent past medical history.  Patient Active Problem List   Diagnosis Date Noted  . CONTACT DERMATITIS&OTHER ECZEMA DUE UNSPEC CAUSE 01/05/2008  . 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 SCOLIOSIS, IDIOPATHIC 08/04/2006  . ATTENTION DEFICIT, W/HYPERACTIVITY 06/25/2006  . RHINITIS, ALLERGIC 06/25/2006   History reviewed. No pertinent surgical history.  Home Medications    Prior to Admission medications   Medication Sig Start Date End Date Taking? Authorizing Provider  cephALEXin (KEFLEX) 500 MG capsule Take 1 capsule (500 mg total) by mouth 4 (four) times daily. 01/28/16   Arnaldo Natal, MD  diphenhydrAMINE (BENADRYL) 25 MG tablet Take 2 tablets (50 mg total) by mouth every 6 (six) hours as needed for itching. 12/05/15   Lavera Guise, MD  hydrocortisone cream 1 % Apply to affected area 2 times daily 12/05/15   Lavera Guise, MD  phenol (CHLORASEPTIC) 1.4 % LIQD Use as directed 1 spray in the mouth or throat as needed for throat irritation / pain. 09/15/15   Garlon Hatchet, PA-C    Family  History No family history on file.  Social History Social History  Substance Use Topics  . Smoking status: Current Every Day Smoker    Types: Cigarettes  . Smokeless tobacco: Never Used  . Alcohol use Yes   Allergies   Blueberry flavor  Review of Systems Review of Systems  Constitutional: Negative for fever.  Gastrointestinal: Positive for nausea.  Musculoskeletal:       +"knot" on left foream  All other systems reviewed and are negative.  Physical Exam Updated Vital Signs BP 111/65   Pulse 60   Temp 98.4 F (36.9 C) (Oral)   Resp 14   SpO2 98%   Physical Exam  Constitutional: He is oriented to person, place, and time. He appears well-developed and well-nourished.  HENT:  Head: Normocephalic.  Eyes: EOM are normal.  Neck: Normal range of motion. Neck supple.  Pulmonary/Chest: Effort normal.  Abdominal: He exhibits no distension.  Musculoskeletal: Normal range of motion.  Strength 5 out of 5 in upper extremities bilaterally.  Lymphadenopathy:    He has no cervical adenopathy.  Neurological: He is alert and oriented to person, place, and time.  Skin: Skin is warm and dry. Capillary refill takes less than 2 seconds.  1 cm cystic lesion noted to the left forearm. Cyst is firm and nonmobile with full range of motion of the left forearm and wrist. Mildly tender to palpation. No erythema, edema, fluctuance noted. No surrounding cellulitis. No purulent drainage is noted. No wound to the fore arm is noted. Radial pulses are  2+ bilaterally. Sensation intact. Cap refill normal. I do not appreciate any axillary lymphadenopathy.  Psychiatric: He has a normal mood and affect.  Nursing note and vitals reviewed.  ED Treatments / Results  Labs (all labs ordered are listed, but only abnormal results are displayed) Labs Reviewed - No data to display  EKG  EKG Interpretation None       Radiology No results found.  Procedures Procedures (including critical care  time)  Medications Ordered in ED Medications  naproxen (NAPROSYN) tablet 500 mg (500 mg Oral Given 05/06/16 1331)    DIAGNOSTIC STUDIES:  Oxygen Saturation is 98% on RA, normal by my interpretation.    COORDINATION OF CARE:  12:40 PM Discussed treatment plan with pt at bedside and pt agreed to plan.  Initial Impression / Assessment and Plan / ED Course  I have reviewed the triage vital signs and the nursing notes.  Pertinent labs & imaging results that were available during my care of the patient were reviewed by me and considered in my medical decision making (see chart for details).  Clinical Course   Patient resents with knot to his left forearm. Lesion is consistent with a cyst and not an abscess. No surrounding erythema, fluctuance, purulent discharge. Patient would likely not benefit from any drainage of the cyst. The cyst is non-mobile and tender. I have encouraged warm compresses and NSAIDs. I have given him a referral to general surgery for further workup and surgery if necessary. Patient does not appear to be any acute distress. No concerning signs of cellulitis. No focal neuro deficits. Pt is hemodynamically stable, in NAD, & able to ambulate in the ED. Pain has been managed & has no complaints prior to dc. Pt is comfortable with above plan and is stable for discharge at this time. All questions were answered prior to disposition. Strict return precautions for f/u to the ED were discussed. Patient was seen and examined by Dr. Jeraldine LootsLockwood and myself and he agrees with the above plan.   I personally performed the services described in this documentation, which was scribed in my presence. The recorded information has been reviewed and is accurate.   Final Clinical Impressions(s) / ED Diagnoses   Final diagnoses:  Skin cyst    New Prescriptions Discharge Medication List as of 05/06/2016  1:24 PM    START taking these medications   Details  naproxen (NAPROSYN) 375 MG tablet Take  1 tablet (375 mg total) by mouth 2 (two) times daily., Starting Tue 05/06/2016, Print         Rise MuKenneth T Anwar Crill, PA-C 05/06/16 1508    Gerhard Munchobert Lockwood, MD 05/06/16 2037

## 2016-05-06 NOTE — Discharge Instructions (Signed)
This is likely a benign cyst. I do not feel that this is an abscess and needs to be drained. I have given you a follow-up referral to general surgery for them to consult on her cyst and removed if needed. I have given him a prescription for naproxen to take up to 2 times a day. You are getting your first dose here in the ED. Use warm compresses on your arm. He may also take Tylenol. Avoid other NSAIDs including Motrin and ibuprofen.

## 2016-05-06 NOTE — ED Triage Notes (Signed)
Pt states "theres a knot in my arm. It stated last night, and its hurting". Pt has small bump in his L arm.

## 2016-05-10 ENCOUNTER — Emergency Department (HOSPITAL_COMMUNITY): Payer: Self-pay

## 2016-05-10 ENCOUNTER — Emergency Department (HOSPITAL_COMMUNITY)
Admission: EM | Admit: 2016-05-10 | Discharge: 2016-05-11 | Disposition: A | Discharge status: Home / Self Care | Payer: Self-pay | Attending: Emergency Medicine | Admitting: Emergency Medicine

## 2016-05-10 ENCOUNTER — Encounter (HOSPITAL_COMMUNITY): Payer: Self-pay

## 2016-05-10 DIAGNOSIS — R197 Diarrhea, unspecified: Secondary | ICD-10-CM | POA: Insufficient documentation

## 2016-05-10 DIAGNOSIS — R059 Cough, unspecified: Secondary | ICD-10-CM

## 2016-05-10 DIAGNOSIS — R112 Nausea with vomiting, unspecified: Secondary | ICD-10-CM | POA: Insufficient documentation

## 2016-05-10 DIAGNOSIS — R51 Headache: Secondary | ICD-10-CM | POA: Insufficient documentation

## 2016-05-10 DIAGNOSIS — R6883 Chills (without fever): Secondary | ICD-10-CM

## 2016-05-10 DIAGNOSIS — R6889 Other general symptoms and signs: Secondary | ICD-10-CM

## 2016-05-10 DIAGNOSIS — L728 Other follicular cysts of the skin and subcutaneous tissue: Secondary | ICD-10-CM | POA: Insufficient documentation

## 2016-05-10 DIAGNOSIS — F17210 Nicotine dependence, cigarettes, uncomplicated: Secondary | ICD-10-CM | POA: Insufficient documentation

## 2016-05-10 DIAGNOSIS — R509 Fever, unspecified: Secondary | ICD-10-CM | POA: Insufficient documentation

## 2016-05-10 DIAGNOSIS — R52 Pain, unspecified: Secondary | ICD-10-CM

## 2016-05-10 DIAGNOSIS — L729 Follicular cyst of the skin and subcutaneous tissue, unspecified: Secondary | ICD-10-CM

## 2016-05-10 DIAGNOSIS — R519 Headache, unspecified: Secondary | ICD-10-CM

## 2016-05-10 DIAGNOSIS — R05 Cough: Secondary | ICD-10-CM

## 2016-05-10 LAB — CBC WITH DIFFERENTIAL/PLATELET
BASOS ABS: 0 10*3/uL (ref 0.0–0.1)
BASOS PCT: 0 %
EOS ABS: 0.2 10*3/uL (ref 0.0–0.7)
Eosinophils Relative: 3 %
HEMATOCRIT: 40.9 % (ref 39.0–52.0)
Hemoglobin: 14.2 g/dL (ref 13.0–17.0)
Lymphocytes Relative: 15 %
Lymphs Abs: 1.1 10*3/uL (ref 0.7–4.0)
MCH: 30.7 pg (ref 26.0–34.0)
MCHC: 34.7 g/dL (ref 30.0–36.0)
MCV: 88.3 fL (ref 78.0–100.0)
Monocytes Absolute: 0.7 10*3/uL (ref 0.1–1.0)
Monocytes Relative: 11 %
NEUTROS ABS: 5 10*3/uL (ref 1.7–7.7)
Neutrophils Relative %: 71 %
Platelets: 279 10*3/uL (ref 150–400)
RBC: 4.63 MIL/uL (ref 4.22–5.81)
RDW: 13.8 % (ref 11.5–15.5)
WBC: 7 10*3/uL (ref 4.0–10.5)

## 2016-05-10 LAB — COMPREHENSIVE METABOLIC PANEL
ALK PHOS: 45 U/L (ref 38–126)
ALT: 14 U/L — ABNORMAL LOW (ref 17–63)
ANION GAP: 14 (ref 5–15)
AST: 25 U/L (ref 15–41)
Albumin: 4.4 g/dL (ref 3.5–5.0)
BUN: 11 mg/dL (ref 6–20)
CALCIUM: 9.6 mg/dL (ref 8.9–10.3)
CO2: 24 mmol/L (ref 22–32)
Chloride: 101 mmol/L (ref 101–111)
Creatinine, Ser: 1.31 mg/dL — ABNORMAL HIGH (ref 0.61–1.24)
Glucose, Bld: 98 mg/dL (ref 65–99)
Potassium: 3.9 mmol/L (ref 3.5–5.1)
SODIUM: 139 mmol/L (ref 135–145)
TOTAL PROTEIN: 6.8 g/dL (ref 6.5–8.1)
Total Bilirubin: 0.8 mg/dL (ref 0.3–1.2)

## 2016-05-10 LAB — I-STAT CG4 LACTIC ACID, ED: Lactic Acid, Venous: 1.06 mmol/L (ref 0.5–1.9)

## 2016-05-10 MED ORDER — SODIUM CHLORIDE 0.9 % IV BOLUS (SEPSIS)
1000.0000 mL | Freq: Once | INTRAVENOUS | Status: AC
  Administered 2016-05-10: 1000 mL via INTRAVENOUS

## 2016-05-10 MED ORDER — BENZONATATE 100 MG PO CAPS
100.0000 mg | ORAL_CAPSULE | Freq: Once | ORAL | Status: AC
  Administered 2016-05-10: 100 mg via ORAL
  Filled 2016-05-10: qty 1

## 2016-05-10 MED ORDER — OXYMETAZOLINE HCL 0.05 % NA SOLN
1.0000 | Freq: Once | NASAL | Status: AC
  Administered 2016-05-10: 1 via NASAL
  Filled 2016-05-10: qty 15

## 2016-05-10 MED ORDER — NAPROXEN 250 MG PO TABS
500.0000 mg | ORAL_TABLET | Freq: Once | ORAL | Status: AC
  Administered 2016-05-10: 500 mg via ORAL
  Filled 2016-05-10: qty 2

## 2016-05-10 MED ORDER — ONDANSETRON 4 MG PO TBDP
4.0000 mg | ORAL_TABLET | Freq: Once | ORAL | Status: AC
  Administered 2016-05-10: 4 mg via ORAL
  Filled 2016-05-10: qty 1

## 2016-05-10 MED ORDER — NAPROXEN 500 MG PO TABS: 500.0000 mg | ORAL_TABLET | Freq: Two times a day (BID) | ORAL | 0 refills | Status: DC

## 2016-05-10 MED ORDER — ONDANSETRON 4 MG PO TBDP: 4.0000 mg | ORAL_TABLET | Freq: Two times a day (BID) | ORAL | 0 refills | Status: DC | PRN

## 2016-05-10 MED ORDER — BENZONATATE 100 MG PO CAPS: 100.0000 mg | ORAL_CAPSULE | Freq: Two times a day (BID) | ORAL | 0 refills | Status: AC | PRN

## 2016-05-10 NOTE — ED Triage Notes (Signed)
Patient complains of 1 week of left arm cyst/abscess, no redness , no drainage. States that since swelling to left arm has had weakness, body pain and chest pain, afebrile, alert and oriented

## 2016-05-10 NOTE — ED Notes (Signed)
ED Provider at bedside. 

## 2016-05-10 NOTE — ED Provider Notes (Signed)
[facility] Provider Note  CSN: [id number] Arrival date & time: 05/10/16  1746  History   Chief Complaint Flu-like symptoms  HPI Mark Norton is a 26 y.o. male.  The history is provided by the patient. No language interpreter was used.  Illness  This is a new problem. The current episode started 2 days ago. The problem occurs constantly. The problem has been gradually worsening. Associated symptoms include headaches. Pertinent negatives include no chest pain, no abdominal pain and no shortness of breath. Nothing aggravates the symptoms. Nothing relieves the symptoms.    History reviewed. No pertinent past medical history.  Patient Active Problem List   Diagnosis Date Noted  . CONTACT DERMATITIS&OTHER ECZEMA DUE UNSPEC CAUSE 01/05/2008  . 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 SCOLIOSIS, IDIOPATHIC 08/04/2006  . ATTENTION DEFICIT, W/HYPERACTIVITY 06/25/2006  . RHINITIS, ALLERGIC 06/25/2006   History reviewed. No pertinent surgical history.   Home Medications    Prior to Admission medications   Medication Sig Start Date End Date Taking? Authorizing Provider  benzonatate (TESSALON) 100 MG capsule Take 1 capsule (100 mg total) by mouth 2 (two) times daily as needed for cough. 05/10/16 05/20/16  Angelina Ok, MD  cephALEXin (KEFLEX) 500 MG capsule Take 1 capsule (500 mg total) by mouth 4 (four) times daily. 01/28/16   Arnaldo Natal, MD  diphenhydrAMINE (BENADRYL) 25 MG tablet Take 2 tablets (50 mg total) by mouth every 6 (six) hours as needed for itching. 12/05/15   Lavera Guise, MD  hydrocortisone cream 1 % Apply to affected area 2 times daily 12/05/15   Lavera Guise, MD  naproxen (NAPROSYN) 500 MG tablet Take 1 tablet (500 mg total) by mouth 2 (two) times daily. 05/10/16   Angelina Ok, MD  ondansetron (ZOFRAN ODT) 4 MG disintegrating tablet Take 1 tablet (4 mg total) by mouth every 12 (twelve) hours as needed for nausea or vomiting. 05/10/16   Angelina Ok, MD  phenol  (CHLORASEPTIC) 1.4 % LIQD Use as directed 1 spray in the mouth or throat as needed for throat irritation / pain. 09/15/15   Garlon Hatchet, PA-C   Family History No family history on file.  Social History Social History  Substance Use Topics  . Smoking status: Current Every Day Smoker    Types: Cigarettes  . Smokeless tobacco: Never Used  . Alcohol use Yes   Allergies   Blueberry flavor  Review of Systems Review of Systems  Constitutional: Positive for appetite change (decreased), chills, fatigue and fever.  HENT: Positive for congestion, postnasal drip and rhinorrhea.   Respiratory: Positive for cough. Negative for shortness of breath.   Cardiovascular: Negative for chest pain, palpitations and leg swelling.  Gastrointestinal: Positive for diarrhea, nausea and vomiting. Negative for abdominal pain.  Genitourinary: Negative for dysuria, frequency and hematuria.  Musculoskeletal: Positive for myalgias.  Neurological: Positive for headaches.  All other systems reviewed and are negative.   Physical Exam Updated Vital Signs BP 125/62   Pulse (!) 48   Temp (S) 100 F (37.8 C) (Oral) Comment: Resdient aware  Resp 18   Ht 5\' 8"  (1.727 m)   Wt 81.6 kg   SpO2 100%   BMI 27.37 kg/m   Physical Exam  Constitutional: He is oriented to person, place, and time. He appears well-developed and well-nourished. No distress.  HENT:  Head: Normocephalic and atraumatic.  Nasal discharge bilaterally  Eyes: EOM are normal. Pupils are equal, round, and reactive to light.  Neck: Normal range  of motion. Neck supple.  Cardiovascular: Normal rate, regular rhythm and normal heart sounds.   Pulmonary/Chest: Effort normal and breath sounds normal.  Abdominal: Soft. Bowel sounds are normal. He exhibits no distension. There is no tenderness.  Musculoskeletal: Normal range of motion.  Firm well defined structure palpated to left forearm, no erythema or induration or fluctuance  Neurological: He is  alert and oriented to person, place, and time.  Skin: Skin is warm and dry. Capillary refill takes less than 2 seconds. He is not diaphoretic.  Nursing note and vitals reviewed.   ED Treatments / Results  Labs (all labs ordered are listed, but only abnormal results are displayed) Labs Reviewed  COMPREHENSIVE METABOLIC PANEL - Abnormal; Notable for the following:       Result Value   Creatinine, Ser 1.31 (*)    ALT 14 (*)    All other components within normal limits  CBC WITH DIFFERENTIAL/PLATELET  I-STAT CG4 LACTIC ACID, ED   EKG  EKG Interpretation None      Radiology Dg Chest 2 View  Result Date: 05/10/2016 CLINICAL DATA:  Left arm cyst/abscess.  Chest pain. EXAM: CHEST  2 VIEW COMPARISON:  October 31, 2013 FINDINGS: The cardiac silhouette is borderline in caliber but unchanged since June 2015. The hila and mediastinum are normal. No pulmonary nodules, masses, or focal infiltrates. IMPRESSION: No active cardiopulmonary disease. Electronically Signed   By: Gerome Sam III M.D   On: 05/10/2016 19:15   Procedures Procedures (including critical care time)  Medications Ordered in ED Medications  oxymetazoline (AFRIN) 0.05 % nasal spray 1 spray (1 spray Each Nare Given 05/10/16 2200)  benzonatate (TESSALON) capsule 100 mg (100 mg Oral Given 05/10/16 2159)  naproxen (NAPROSYN) tablet 500 mg (500 mg Oral Given 05/10/16 2159)  ondansetron (ZOFRAN-ODT) disintegrating tablet 4 mg (4 mg Oral Given 05/10/16 2200)  sodium chloride 0.9 % bolus 1,000 mL (1,000 mLs Intravenous New Bag/Given 05/10/16 2228)    Initial Impression / Assessment and Plan / ED Course  I have reviewed the triage vital signs and the nursing notes.  Clinical Course   26 y.o. male with above stated PMHx, HPI, and physical. Onset over the past 2 days. Fever, chills, headache, body aches, cough, postnasal drip, nausea, nonbloody nonbilious emesis, loose nonbloody diarrhea.  Suspect viral syndrome/flu-like illness. Young  and otherwise healthy. Will forgo flu testing given not within high risk group and not necessitating treatment. BMP w/ mild AKI (Cr 1.3). Given IVF's. CBC & LA unremarkable. Chest x-ray showed no acute cardiology disease. Laboratory and imaging results were personally reviewed by myself and used in the medical decision making of this patient's treatment and disposition.  Patient given aspirin, naproxen, Zofran, and Tessalon Perles in the emergency department prescriptions for the same. Patient's left forearm cyst was evaluated 4 days ago he is been given a referral to a general surgeon for elective removal. Patient states that he is not called to make the appointment yet and I advised him to do so early next week. Pt discharged home in stable condition. Strict ED return precautions dicussed. Pt understands and agrees with the plan and has no further questions or concerns.   Pt care discussed with and followed by my attending, Dr. Asencion Partridge, MD Pager [phone]  Final Clinical Impressions(s) / ED Diagnoses   Final diagnoses:  Fever in adult  Chills  Cough  Nausea, vomiting, and diarrhea  Throbbing headache  Body aches  Flu-like symptoms  Skin  cyst   New Prescriptions New Prescriptions   BENZONATATE (TESSALON) 100 MG CAPSULE    Take 1 capsule (100 mg total) by mouth 2 (two) times daily as needed for cough.   NAPROXEN (NAPROSYN) 500 MG TABLET    Take 1 tablet (500 mg total) by mouth 2 (two) times daily.   ONDANSETRON (ZOFRAN ODT) 4 MG DISINTEGRATING TABLET    Take 1 tablet (4 mg total) by mouth every 12 (twelve) hours as needed for nausea or vomiting.     Angelina Okyan Aleria Maheu, MD 05/10/16 [id number]    Maia PlanJoshua G Long, MD 05/11/16 1159

## 2016-05-28 ENCOUNTER — Inpatient Hospital Stay: Payer: Self-pay | Admitting: Family Medicine

## 2016-11-25 ENCOUNTER — Emergency Department (HOSPITAL_COMMUNITY)
Admission: EM | Admit: 2016-11-25 | Discharge: 2016-11-25 | Disposition: A | Discharge status: Home / Self Care | Payer: Self-pay | Attending: Emergency Medicine | Admitting: Emergency Medicine

## 2016-11-25 ENCOUNTER — Encounter (HOSPITAL_COMMUNITY): Payer: Self-pay | Admitting: Emergency Medicine

## 2016-11-25 DIAGNOSIS — H5712 Ocular pain, left eye: Secondary | ICD-10-CM | POA: Insufficient documentation

## 2016-11-25 DIAGNOSIS — H5789 Other specified disorders of eye and adnexa: Secondary | ICD-10-CM

## 2016-11-25 DIAGNOSIS — H578 Other specified disorders of eye and adnexa: Secondary | ICD-10-CM | POA: Insufficient documentation

## 2016-11-25 DIAGNOSIS — F17210 Nicotine dependence, cigarettes, uncomplicated: Secondary | ICD-10-CM | POA: Insufficient documentation

## 2016-11-25 MED ORDER — ERYTHROMYCIN 5 MG/GM OP OINT
TOPICAL_OINTMENT | OPHTHALMIC | 0 refills | Status: DC
Start: 2016-11-25 — End: 2019-08-21

## 2016-11-25 MED ORDER — TETRACAINE HCL 0.5 % OP SOLN
1.0000 [drp] | Freq: Once | OPHTHALMIC | Status: AC
  Administered 2016-11-25: 1 [drp] via OPHTHALMIC
  Filled 2016-11-25: qty 4

## 2016-11-25 MED ORDER — IBUPROFEN 400 MG PO TABS
600.0000 mg | ORAL_TABLET | Freq: Once | ORAL | Status: AC
  Administered 2016-11-25: 600 mg via ORAL
  Filled 2016-11-25: qty 1

## 2016-11-25 MED ORDER — FLUORESCEIN SODIUM 0.6 MG OP STRP
1.0000 | ORAL_STRIP | Freq: Once | OPHTHALMIC | Status: AC
  Administered 2016-11-25: 1 via OPHTHALMIC
  Filled 2016-11-25: qty 1

## 2016-11-25 NOTE — ED Triage Notes (Signed)
Pt. Stated, I got a piece of saw dust in my left eye about 2 hours ago.

## 2016-11-25 NOTE — Discharge Instructions (Signed)
Please read attached information. If you experience any new or worsening signs or symptoms please return to the emergency room for evaluation. Please follow-up with your primary care provider or specialist as discussed. Please use medication prescribed only as directed and discontinue taking if you have any concerning signs or symptoms.   °
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
## 2019-05-13 ENCOUNTER — Emergency Department (HOSPITAL_COMMUNITY)
Admission: EM | Admit: 2019-05-13 | Discharge: 2019-05-13 | Disposition: A | Discharge status: Home / Self Care | Payer: Self-pay | Attending: Emergency Medicine | Admitting: Emergency Medicine

## 2019-05-13 ENCOUNTER — Encounter (HOSPITAL_COMMUNITY): Payer: Self-pay | Admitting: Emergency Medicine

## 2019-05-13 ENCOUNTER — Other Ambulatory Visit: Payer: Self-pay

## 2019-05-13 DIAGNOSIS — F17210 Nicotine dependence, cigarettes, uncomplicated: Secondary | ICD-10-CM | POA: Insufficient documentation

## 2019-05-13 DIAGNOSIS — Z79899 Other long term (current) drug therapy: Secondary | ICD-10-CM | POA: Insufficient documentation

## 2019-05-13 DIAGNOSIS — M542 Cervicalgia: Secondary | ICD-10-CM | POA: Insufficient documentation

## 2019-05-13 DIAGNOSIS — Y92410 Unspecified street and highway as the place of occurrence of the external cause: Secondary | ICD-10-CM | POA: Insufficient documentation

## 2019-05-13 DIAGNOSIS — Y999 Unspecified external cause status: Secondary | ICD-10-CM | POA: Insufficient documentation

## 2019-05-13 DIAGNOSIS — Y93I9 Activity, other involving external motion: Secondary | ICD-10-CM | POA: Insufficient documentation

## 2019-05-13 MED ORDER — CYCLOBENZAPRINE HCL 10 MG PO TABS: 10.0000 mg | ORAL_TABLET | Freq: Two times a day (BID) | ORAL | 0 refills | Status: AC | PRN

## 2019-05-13 MED ORDER — IBUPROFEN 600 MG PO TABS: 600.0000 mg | ORAL_TABLET | Freq: Four times a day (QID) | ORAL | 0 refills | Status: DC | PRN
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
## 2019-08-16 ENCOUNTER — Emergency Department
Admission: EM | Admit: 2019-08-16 | Discharge: 2019-08-16 | Disposition: A | Discharge status: Home / Self Care | Payer: Self-pay | Attending: Emergency Medicine | Admitting: Emergency Medicine

## 2019-08-16 ENCOUNTER — Emergency Department: Payer: Self-pay

## 2019-08-16 ENCOUNTER — Encounter: Payer: Self-pay | Admitting: Emergency Medicine

## 2019-08-16 ENCOUNTER — Other Ambulatory Visit: Payer: Self-pay

## 2019-08-16 DIAGNOSIS — M79674 Pain in right toe(s): Secondary | ICD-10-CM | POA: Insufficient documentation

## 2019-08-16 DIAGNOSIS — Z79899 Other long term (current) drug therapy: Secondary | ICD-10-CM | POA: Insufficient documentation

## 2019-08-16 DIAGNOSIS — Z87891 Personal history of nicotine dependence: Secondary | ICD-10-CM | POA: Insufficient documentation

## 2019-08-16 DIAGNOSIS — Z791 Long term (current) use of non-steroidal anti-inflammatories (NSAID): Secondary | ICD-10-CM | POA: Insufficient documentation

## 2019-08-16 DIAGNOSIS — F909 Attention-deficit hyperactivity disorder, unspecified type: Secondary | ICD-10-CM | POA: Insufficient documentation

## 2019-08-16 LAB — URIC ACID: Uric Acid, Serum: 5.3 mg/dL (ref 3.7–8.6)

## 2019-08-16 MED ORDER — NAPROXEN 500 MG PO TABS: 500.0000 mg | ORAL_TABLET | Freq: Two times a day (BID) | ORAL | Status: DC

## 2019-08-16 MED ORDER — NAPROXEN 500 MG PO TABS
500.0000 mg | ORAL_TABLET | Freq: Once | ORAL | Status: DC
  Filled 2019-08-16: qty 1

## 2019-08-16 NOTE — Discharge Instructions (Addendum)
No objective findings for your complaint.  X-rays and uric acid levels were unremarkable.  Advised to follow-up with podiatrist which is a foot specialist for definitive evaluation and treatment.

## 2019-08-16 NOTE — ED Notes (Signed)
Pt with right toe #1 pain at joint. Pain only with ambulation. Pt denies injury. Pt with full rom and sensation.
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
## 2019-08-16 NOTE — ED Triage Notes (Addendum)
Patient ambulatory to triage with steady gait, without difficulty or distress noted; pt reports rt great toe pain since yesterday with no known injury or hx of same

## 2019-08-21 ENCOUNTER — Encounter (HOSPITAL_COMMUNITY): Payer: Self-pay

## 2019-08-21 ENCOUNTER — Other Ambulatory Visit: Payer: Self-pay

## 2019-08-21 ENCOUNTER — Ambulatory Visit (HOSPITAL_COMMUNITY)
Admission: EM | Admit: 2019-08-21 | Discharge: 2019-08-21 | Disposition: A | Discharge status: Home / Self Care | Payer: PRIVATE HEALTH INSURANCE | Attending: Family Medicine | Admitting: Family Medicine

## 2019-08-21 DIAGNOSIS — L0501 Pilonidal cyst with abscess: Secondary | ICD-10-CM

## 2019-08-21 MED ORDER — AMOXICILLIN-POT CLAVULANATE 875-125 MG PO TABS: 1.0000 | ORAL_TABLET | Freq: Two times a day (BID) | ORAL | 0 refills | Status: AC

## 2019-08-21 NOTE — ED Provider Notes (Signed)
[facility]    CSN: [id number] Arrival date & time: 08/21/19  1430      History   Chief Complaint Chief Complaint  Patient presents with  . blister on left buttocks    HPI EDAHI Mark Norton is a 29 y.o. male.   HPI  Patient has a painful bump on his backside.  He states that it started draining today.  He is here because he thinks it is infected.  He would like antibiotics.  He has never had this before, it is not recurrent. No fever chills No trauma  History reviewed. No pertinent past medical history.  Patient Active Problem List   Diagnosis Date Noted  . CONTACT DERMATITIS&OTHER ECZEMA DUE UNSPEC CAUSE 01/05/2008  . 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 SCOLIOSIS, IDIOPATHIC 08/04/2006  . ATTENTION DEFICIT, W/HYPERACTIVITY 06/25/2006  . RHINITIS, ALLERGIC 06/25/2006    History reviewed. No pertinent surgical history.     Home Medications    Prior to Admission medications   Medication Sig Start Date End Date Taking? Authorizing Provider  amoxicillin-clavulanate (AUGMENTIN) 875-125 MG tablet Take 1 tablet by mouth every 12 (twelve) hours. 08/21/19   Eustace Moore, MD  cyclobenzaprine (FLEXERIL) 10 MG tablet Take 1 tablet (10 mg total) by mouth 2 (two) times daily as needed for muscle spasms. 05/13/19   Fayrene Helper, PA-C  ibuprofen (ADVIL) 600 MG tablet Take 1 tablet (600 mg total) by mouth every 6 (six) hours as needed. 05/13/19   Fayrene Helper, PA-C  diphenhydrAMINE (BENADRYL) 25 MG tablet Take 2 tablets (50 mg total) by mouth every 6 (six) hours as needed for itching. Patient not taking: Reported on 05/10/2016 12/05/15 05/13/19  Lavera Guise, MD  fluticasone [facility]) 50 MCG/ACT nasal spray 1 spray daily. In each nostril   01/13/13  [provider]    Family History Family History  Problem Relation Age of Onset  . Healthy Mother   . Healthy Father     Social History Social History   Tobacco Use  . Smoking status: Former Smoker    Types:  Cigarettes  . Smokeless tobacco: Never Used  Substance Use Topics  . Alcohol use: Never  . Drug use: No     Allergies   Blueberry flavor   Review of Systems Review of Systems  Skin: Positive for wound.     Physical Exam Triage Vital Signs ED Triage Vitals  Enc Vitals Group     BP 08/21/19 1506 (!) 108/54     Pulse Rate 08/21/19 1506 61     Resp 08/21/19 1506 16     Temp 08/21/19 1506 98.3 F (36.8 C)     Temp Source 08/21/19 1506 Oral     SpO2 08/21/19 1506 99 %     Weight 08/21/19 1509 185 lb (83.9 kg)     Height 08/21/19 1509 5\' 8"  (1.727 m)     Head Circumference --      Peak Flow --      Pain Score 08/21/19 1509 6     Pain Loc --      Pain Edu? --      Excl. in GC? --    No data found.  Updated Vital Signs BP (!) 108/54   Pulse 61   Temp 98.3 F (36.8 C) (Oral)   Resp 16   Ht 5\' 8"  (1.727 m)   Wt 83.9 kg   SpO2 99%   BMI 28.13 kg/m  Physical Exam Constitutional:      General: He is not in acute distress.    Appearance: Normal appearance. He is well-developed and normal weight. He is not ill-appearing.  HENT:     Head: Normocephalic and atraumatic.     Nose:     Comments: Mask is in place Eyes:     Conjunctiva/sclera: Conjunctivae normal.     Pupils: Pupils are equal, round, and reactive to light.  Cardiovascular:     Rate and Rhythm: Normal rate.  Pulmonary:     Effort: Pulmonary effort is normal. No respiratory distress.  Musculoskeletal:        General: Normal range of motion.     Cervical back: Normal range of motion.       Back:  Skin:    General: Skin is warm and dry.  Neurological:     Mental Status: He is alert.  Psychiatric:        Mood and Affect: Mood normal.        Behavior: Behavior normal.      UC Treatments / Results  Labs (all labs ordered are listed, but only abnormal results are displayed) Labs Reviewed - No data to display  EKG   Radiology No results found.  Procedures Procedures (including  critical care time)  Medications Ordered in UC Medications - No data to display  Initial Impression / Assessment and Plan / UC Course  I have reviewed the triage vital signs and the nursing notes.  Pertinent labs & imaging results that were available during my care of the patient were reviewed by me and considered in my medical decision making (see chart for details).      Final Clinical Impressions(s) / UC Diagnoses   Final diagnoses:  Sacrococcygeal pilonidal cyst with abscess     Discharge Instructions     Take the antibiotic 2 x a day Warm compresses to area Return as needed   ED Prescriptions    Medication Sig Dispense Auth. Provider   amoxicillin-clavulanate (AUGMENTIN) 875-125 MG tablet Take 1 tablet by mouth every 12 (twelve) hours. 14 tablet Raylene Everts, MD     PDMP not reviewed this encounter.   Raylene Everts, MD 08/21/19 [phone]

## 2019-08-21 NOTE — ED Triage Notes (Signed)
Pt c/o blister on left buttocksx4 days. Pt tried treating it himself w/o resolve.

## 2019-08-21 NOTE — Discharge Instructions (Addendum)
Take the antibiotic 2 x a day Warm compresses to area Return as needed

## 2019-08-22 ENCOUNTER — Other Ambulatory Visit: Payer: Self-pay

## 2019-08-22 ENCOUNTER — Ambulatory Visit (HOSPITAL_COMMUNITY)
Admission: EM | Admit: 2019-08-22 | Discharge: 2019-08-22 | Disposition: A | Discharge status: Home / Self Care | Payer: Self-pay | Attending: Family Medicine | Admitting: Family Medicine

## 2019-08-22 DIAGNOSIS — M79632 Pain in left forearm: Secondary | ICD-10-CM

## 2019-08-22 DIAGNOSIS — S51812A Laceration without foreign body of left forearm, initial encounter: Secondary | ICD-10-CM

## 2019-08-22 NOTE — ED Triage Notes (Signed)
Small wound to L forearm after hitting a dresser. Bleeding controlled.

## 2019-08-22 NOTE — Discharge Instructions (Signed)
I have placed 4 sutures in your left forearm.   You may change your bandage in the morning. You may apply neosporin to the area as needed.   Follow up in 7 days for suture removal.

## 2019-08-24 DIAGNOSIS — S51812A Laceration without foreign body of left forearm, initial encounter: Secondary | ICD-10-CM

## 2019-08-24 NOTE — ED Provider Notes (Signed)
[facility]    CSN: [id number] Arrival date & time: 08/22/19  1610      History   Chief Complaint Chief Complaint  Patient presents with  . Laceration    HPI Mark Norton is a 29 y.o. male.   Reports that he was helping move a heavy dresser today, and he cut his left forearm on a piece of metal while he was moving it.  Reports laceration to left forearm.  Denies any attempts to clean or treat at home.  Injury occurred about an hour ago.  Denies headache, sore throat, shortness of breath, nausea, vomiting, diarrhea, fever, rash, other symptoms.  ROS per HPI  The history is provided by the patient.  Laceration   No past medical history on file.  Patient Active Problem List   Diagnosis Date Noted  . CONTACT DERMATITIS&OTHER ECZEMA DUE UNSPEC CAUSE 01/05/2008  . 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 SCOLIOSIS, IDIOPATHIC 08/04/2006  . ATTENTION DEFICIT, W/HYPERACTIVITY 06/25/2006  . RHINITIS, ALLERGIC 06/25/2006    No past surgical history on file.     Home Medications    Prior to Admission medications   Medication Sig Start Date End Date Taking? Authorizing Provider  amoxicillin-clavulanate (AUGMENTIN) 875-125 MG tablet Take 1 tablet by mouth every 12 (twelve) hours. 08/21/19   Eustace Moore, MD  cyclobenzaprine (FLEXERIL) 10 MG tablet Take 1 tablet (10 mg total) by mouth 2 (two) times daily as needed for muscle spasms. 05/13/19   Fayrene Helper, PA-C  ibuprofen (ADVIL) 600 MG tablet Take 1 tablet (600 mg total) by mouth every 6 (six) hours as needed. 05/13/19   Fayrene Helper, PA-C  diphenhydrAMINE (BENADRYL) 25 MG tablet Take 2 tablets (50 mg total) by mouth every 6 (six) hours as needed for itching. Patient not taking: Reported on 05/10/2016 12/05/15 05/13/19  Lavera Guise, MD  fluticasone [facility]) 50 MCG/ACT nasal spray 1 spray daily. In each nostril   01/13/13  [provider]    Family History Family History  Problem Relation Age of Onset    . Healthy Mother   . Healthy Father     Social History Social History   Tobacco Use  . Smoking status: Former Smoker    Types: Cigarettes  . Smokeless tobacco: Never Used  Substance Use Topics  . Alcohol use: Never  . Drug use: No     Allergies   Blueberry flavor   Review of Systems Review of Systems   Physical Exam Triage Vital Signs ED Triage Vitals [08/22/19 1648]  Enc Vitals Group     BP (!) 111/57     Pulse Rate (!) 57     Resp 16     Temp 97.8 F (36.6 C)     Temp src      SpO2 99 %     Weight      Height      Head Circumference      Peak Flow      Pain Score 0     Pain Loc      Pain Edu?      Excl. in GC?    No data found.  Updated Vital Signs BP (!) 111/57   Pulse (!) 57   Temp 97.8 F (36.6 C)   Resp 16   SpO2 99%   Visual Acuity Right Eye Distance:   Left Eye Distance:   Bilateral Distance:    Right Eye Near:   Left Eye Near:  Bilateral Near:     Physical Exam Vitals and nursing note reviewed.  Constitutional:      General: He is not in acute distress.    Appearance: Normal appearance. He is well-developed and normal weight. He is not ill-appearing.  HENT:     Head: Normocephalic and atraumatic.  Eyes:     Conjunctiva/sclera: Conjunctivae normal.  Cardiovascular:     Rate and Rhythm: Normal rate and regular rhythm.     Heart sounds: Normal heart sounds. No murmur.  Pulmonary:     Effort: Pulmonary effort is normal. No respiratory distress.     Breath sounds: Normal breath sounds. No stridor. No wheezing, rhonchi or rales.  Chest:     Chest wall: No tenderness.  Abdominal:     General: Bowel sounds are normal.     Palpations: Abdomen is soft.     Tenderness: There is no abdominal tenderness.  Musculoskeletal:        General: Normal range of motion.     Cervical back: Normal range of motion and neck supple.  Skin:    General: Skin is warm and dry.     Capillary Refill: Capillary refill takes less than 2 seconds.      Findings: Laceration present.       Neurological:     General: No focal deficit present.     Mental Status: He is alert and oriented to person, place, and time.  Psychiatric:        Mood and Affect: Mood normal.        Behavior: Behavior normal.        Thought Content: Thought content normal.      UC Treatments / Results  Labs (all labs ordered are listed, but only abnormal results are displayed) Labs Reviewed - No data to display  EKG   Radiology No results found.  Procedures Laceration Repair  Date/Time: 08/24/2019 12:19 PM Performed by: Moshe Cipro, NP Authorized by: Moshe Cipro, NP   Consent:    Consent obtained:  Verbal   Consent given by:  Patient   Risks discussed:  Infection Anesthesia (see MAR for exact dosages):    Anesthesia method:  Local infiltration   Local anesthetic:  Lidocaine 2% w/o epi Laceration details:    Location:  Shoulder/arm   Shoulder/arm location:  L lower arm   Length (cm):  1   Depth (mm):  2 Repair type:    Repair type:  Simple Exploration:    Hemostasis achieved with:  Direct pressure   Wound exploration: entire depth of wound probed and visualized     Contaminated: no   Treatment:    Area cleansed with:  Betadine and Shur-Clens   Amount of cleaning:  Standard   Irrigation solution:  Sterile saline   Irrigation volume:  10mL   Irrigation method:  Syringe   Visualized foreign bodies/material removed: no   Skin repair:    Repair method:  Sutures   Suture size:  4-0   Suture material:  Nylon   Number of sutures:  4 Approximation:    Approximation:  Close Post-procedure details:    Dressing:  Bulky dressing   Patient tolerance of procedure:  Tolerated well, no immediate complications   (including critical care time)  Medications Ordered in UC Medications - No data to display  Initial Impression / Assessment and Plan / UC Course  I have reviewed the triage vital signs and the nursing  notes.  Pertinent labs & imaging results that  were available during my care of the patient were reviewed by me and considered in my medical decision making (see chart for details).     Pain of L forearm Laceration: Presents with 1 cm laceration that is open, bleeding controlled today in her aspect of the left forearm.  Was caught on a metal object while moving a dresser about an hour ago.  Patient is up-to-date on tetanus vaccine.  Sutured area with four-point 0 Ethilon times 4 sutures.  Local anesthesia with 2% lidocaine without epi.  Tolerated procedure well.  Instructed to follow-up if he is noticing any redness, heat, warmth, drainage, from the area.  Otherwise, he may follow-up in 7 to 10 days for suture removal.  Patient verbalizes understanding is in agreement with treatment plan. Final Clinical Impressions(s) / UC Diagnoses   Final diagnoses:  Laceration of left forearm, initial encounter  Pain of left forearm     Discharge Instructions     I have placed 4 sutures in your left forearm.   You may change your bandage in the morning. You may apply neosporin to the area as needed.   Follow up in 7 days for suture removal.       ED Prescriptions    None     PDMP not reviewed this encounter.   Faustino Congress, NP 08/24/19 1221

## 2019-09-02 ENCOUNTER — Other Ambulatory Visit: Payer: Self-pay

## 2019-09-02 ENCOUNTER — Ambulatory Visit (HOSPITAL_COMMUNITY)
Admission: EM | Admit: 2019-09-02 | Discharge: 2019-09-02 | Disposition: A | Discharge status: Home / Self Care | Payer: Self-pay

## 2019-09-02 NOTE — ED Notes (Signed)
Bed: UCTR Expected date:  Expected time:  Means of arrival:  Comments: Sick beds 8-10

## 2019-10-04 ENCOUNTER — Encounter: Payer: Self-pay | Admitting: Family Medicine

## 2019-10-04 ENCOUNTER — Ambulatory Visit: Payer: Self-pay | Admitting: Family Medicine

## 2019-10-04 ENCOUNTER — Other Ambulatory Visit: Payer: Self-pay

## 2019-10-04 DIAGNOSIS — B977 Papillomavirus as the cause of diseases classified elsewhere: Secondary | ICD-10-CM

## 2019-10-04 DIAGNOSIS — B9689 Other specified bacterial agents as the cause of diseases classified elsewhere: Secondary | ICD-10-CM

## 2019-10-04 DIAGNOSIS — Z113 Encounter for screening for infections with a predominantly sexual mode of transmission: Secondary | ICD-10-CM

## 2019-10-04 DIAGNOSIS — Z23 Encounter for immunization: Secondary | ICD-10-CM

## 2019-10-04 DIAGNOSIS — L089 Local infection of the skin and subcutaneous tissue, unspecified: Secondary | ICD-10-CM

## 2019-10-04 LAB — GRAM STAIN

## 2019-10-04 MED ORDER — IMIQUIMOD 5 % EX CREA: TOPICAL_CREAM | CUTANEOUS | 1 refills | Status: AC

## 2019-10-04 MED ORDER — CEPHALEXIN 500 MG PO CAPS: 500.0000 mg | ORAL_CAPSULE | Freq: Three times a day (TID) | ORAL | 0 refills | Status: AC

## 2019-10-04 MED ORDER — AZITHROMYCIN 500 MG PO TABS
1000.0000 mg | ORAL_TABLET | Freq: Once | ORAL | Status: AC
  Administered 2019-10-04: 1000 mg via ORAL

## 2019-10-04 NOTE — Progress Notes (Signed)
Gram Stain results reviewed. Patient treated for NGU per standing orders. Patient wants to start HPV vaccine series. HPV vaccine #1 administered. Instructed to call and schedule vaccine appt around 11/01/19 for next vaccine. Tawny Hopping, RN

## 2019-10-04 NOTE — Progress Notes (Signed)
[facility] Department STI clinic/screening visit  Subjective:  Mark Norton is a 29 y.o. male being seen today for an STI screening visit. The patient reports they do have symptoms.    Patient has the following medical conditions:   Patient Active Problem List   Diagnosis Date Noted  . CONTACT DERMATITIS&OTHER ECZEMA DUE UNSPEC CAUSE 01/05/2008  . 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 SCOLIOSIS, IDIOPATHIC 08/04/2006  . ATTENTION DEFICIT, W/HYPERACTIVITY 06/25/2006  . RHINITIS, ALLERGIC 06/25/2006     Chief Complaint  Patient presents with  . SEXUALLY TRANSMITTED DISEASE    HPI  Patient reports he noticed a bump on his penis x 1 year ago.  States that he has no symptoms from the bump.  Denies that his partner has any symptoms.  Client states he thinks that his pilonidal cyst has flared up.  He had noticed discharge on his underwear and he has lower back pain.  States that he was treated at the ED with antibiotics, denies surgery or I & D.  Client has no PCP. Client states that his partner is pregnant.  See flowsheet for further details and programmatic requirements.    The following portions of the patient's history were reviewed and updated as appropriate: allergies, current medications, past medical history, past social history, past surgical history and problem list.  Objective:  There were no vitals filed for this visit.  Physical Exam Constitutional:      Appearance: Normal appearance.  HENT:     Head: Normocephalic and atraumatic.     Comments: No nits or hair loss    Mouth/Throat:     Mouth: Mucous membranes are moist.     Pharynx: Oropharynx is clear. No oropharyngeal exudate or posterior oropharyngeal erythema.  Pulmonary:     Effort: Pulmonary effort is normal.  Abdominal:     General: Abdomen is flat.     Palpations: Abdomen is soft. There is no hepatomegaly or mass.     Tenderness: There is no abdominal tenderness.  Genitourinary:     Pubic Area: No rash or pubic lice.      Penis: Circumcised. Lesions present. No phimosis, erythema, tenderness, discharge or swelling.      Testes: Normal.     Epididymis:     Right: Normal.     Left: Normal.     Rectum: Normal.     Comments: Numerous raised lesions located around upper shaft. No disch, erythema or tenderness. Lymphadenopathy:     Head:     Right side of head: No preauricular or posterior auricular adenopathy.     Left side of head: No preauricular or posterior auricular adenopathy.     Cervical: No cervical adenopathy.     Upper Body:     Right upper body: No supraclavicular or axillary adenopathy.     Left upper body: No supraclavicular or axillary adenopathy.     Lower Body: No right inguinal adenopathy. No left inguinal adenopathy.  Skin:    General: Skin is warm and dry.     Findings: Lesion present. No rash.     Comments: R buttock- 2x2 cm erythematous, indurated lesion noted on upper R buttock, tender with palpation, no discharge noted.   Neurological:     Mental Status: He is alert and oriented to person, place, and time.    Assessment and Plan:  Mark Norton is a 29 y.o. male presenting to the [facility] Department for STI screening  1. Screening examination  for venereal disease Treat gram stain as per SO - Gram stain - Gonococcus culture - HIV/HCV [facility] - Syphilis Serology, [facility]  2. Skin infection, bacterial - cephALEXin (KEFLEX) 500 MG capsule; Take 1 capsule (500 mg total) by mouth 3 (three) times daily for 7 days.  Dispense: 21 capsule; Refill: 0 -Co. Client to establish PCP to manage his primary care needs. - Co on hand hygiene if comes in contact with the area.  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 comes into clinic today for cryotx.  This is his first treatment.  Desires to proceed with cryotx today. WDWN male in NAD, A&O x 3; skin is warm and dry with HPV lesion noted on his upper penis.  No edema, erythema, or  ulcerative lesions present today. Cryo treatment in 3 freeze/thaw cycles today.  Patient tolerated procedure well. Reviewed with patient after-care instructions and when to call clinic. No sex until area has completely healed. Rec condoms with all sex RTC in 10-14 days for next treatment if needed or may use the Imiquimod treatment for home treatment. - imiquimod (ALDARA) 5 % cream; Apply topically 3 (three) times a week. Apply every other day 3 times/week  Dispense: 12 each; Refill: 1  Co to use a Q-tip to apply cream to areas. - Co. To notify partner of HPV dx in order for her to be evaluated by her OB. -Client requested HPV vaccine today, States that he has health insurance.     No follow-ups on file.  No future appointments.  Hassell Done, FNP

## 2019-10-11 ENCOUNTER — Encounter: Payer: Self-pay | Admitting: Family Medicine

## 2019-10-11 LAB — GONOCOCCUS CULTURE

## 2019-10-11 LAB — HM HEPATITIS C SCREENING LAB: HM Hepatitis Screen: NEGATIVE

## 2019-10-11 LAB — HM HIV SCREENING LAB: HM HIV Screening: NEGATIVE

## 2019-11-02 ENCOUNTER — Ambulatory Visit: Payer: Self-pay

## 2021-11-22 ENCOUNTER — Ambulatory Visit: Payer: PRIVATE HEALTH INSURANCE | Admitting: Podiatry

## 2022-10-30 ENCOUNTER — Other Ambulatory Visit: Payer: Self-pay

## 2022-10-30 ENCOUNTER — Emergency Department: Payer: Self-pay

## 2022-10-30 ENCOUNTER — Emergency Department
Admission: EM | Admit: 2022-10-30 | Discharge: 2022-10-30 | Disposition: A | Discharge status: Home / Self Care | Payer: Self-pay | Attending: Emergency Medicine | Admitting: Emergency Medicine

## 2022-10-30 DIAGNOSIS — Z20822 Contact with and (suspected) exposure to covid-19: Secondary | ICD-10-CM | POA: Insufficient documentation

## 2022-10-30 DIAGNOSIS — J40 Bronchitis, not specified as acute or chronic: Secondary | ICD-10-CM | POA: Insufficient documentation

## 2022-10-30 DIAGNOSIS — R0789 Other chest pain: Secondary | ICD-10-CM

## 2022-10-30 LAB — COMPREHENSIVE METABOLIC PANEL
ALT: 12 U/L (ref 0–44)
AST: 15 U/L (ref 15–41)
Albumin: 4.6 g/dL (ref 3.5–5.0)
Alkaline Phosphatase: 64 U/L (ref 38–126)
Anion gap: 9 (ref 5–15)
BUN: 12 mg/dL (ref 6–20)
CO2: 22 mmol/L (ref 22–32)
Calcium: 8.9 mg/dL (ref 8.9–10.3)
Chloride: 105 mmol/L (ref 98–111)
Creatinine, Ser: 1.11 mg/dL (ref 0.61–1.24)
GFR, Estimated: >60 mL/min (ref >60)
Glucose, Bld: 102 mg/dL — ABNORMAL HIGH (ref 70–99)
Potassium: 3.9 mmol/L (ref 3.5–5.1)
Sodium: 136 mmol/L (ref 135–145)
Total Bilirubin: 0.8 mg/dL (ref 0.3–1.2)
Total Protein: 7.5 g/dL (ref 6.5–8.1)

## 2022-10-30 LAB — CBC
HCT: 41 % (ref 39.0–52.0)
Hemoglobin: 13.6 g/dL (ref 13.0–17.0)
MCH: 29.1 pg (ref 26.0–34.0)
MCHC: 33.2 g/dL (ref 30.0–36.0)
MCV: 87.8 fL (ref 80.0–100.0)
Platelets: 379 10*3/uL (ref 150–400)
RBC: 4.67 MIL/uL (ref 4.22–5.81)
RDW: 14.3 % (ref 11.5–15.5)
WBC: 7.9 10*3/uL (ref 4.0–10.5)
nRBC: 0 % (ref 0.0–0.2)

## 2022-10-30 LAB — SARS CORONAVIRUS 2 BY RT PCR: SARS Coronavirus 2 by RT PCR: NEGATIVE

## 2022-10-30 LAB — TROPONIN I (HIGH SENSITIVITY)
Troponin I (High Sensitivity): 3 ng/L (ref <18)
Troponin I (High Sensitivity): 3 ng/L (ref <18)

## 2022-10-30 MED ORDER — ONDANSETRON 4 MG PO TBDP
4.0000 mg | ORAL_TABLET | Freq: Once | ORAL | Status: AC
  Administered 2022-10-30: 4 mg via ORAL
  Filled 2022-10-30: qty 1

## 2022-10-30 MED ORDER — IPRATROPIUM-ALBUTEROL 0.5-2.5 (3) MG/3ML IN SOLN
3.0000 mL | Freq: Once | RESPIRATORY_TRACT | Status: AC
  Administered 2022-10-30: 3 mL via RESPIRATORY_TRACT
  Filled 2022-10-30: qty 3

## 2022-10-30 MED ORDER — HYDROCODONE-ACETAMINOPHEN 5-325 MG PO TABS
2.0000 | ORAL_TABLET | Freq: Once | ORAL | Status: AC
  Administered 2022-10-30: 2 via ORAL
  Filled 2022-10-30: qty 2

## 2022-10-30 MED ORDER — AZITHROMYCIN 250 MG PO TABS: ORAL_TABLET | ORAL | 0 refills | Status: AC

## 2022-10-30 MED ORDER — IBUPROFEN 600 MG PO TABS: 600.0000 mg | ORAL_TABLET | Freq: Three times a day (TID) | ORAL | 0 refills | Status: AC | PRN

## 2022-10-30 MED ORDER — PREDNISONE 20 MG PO TABS
60.0000 mg | ORAL_TABLET | Freq: Once | ORAL | Status: AC
  Administered 2022-10-30: 60 mg via ORAL
  Filled 2022-10-30: qty 3

## 2022-10-30 MED ORDER — PREDNISONE 20 MG PO TABS: 40.0000 mg | ORAL_TABLET | Freq: Every day | ORAL | 0 refills | Status: AC

## 2022-10-30 NOTE — ED Triage Notes (Signed)
Pt presents to ER with c/o chest pain that started appx 6 hours ago while pt was laying down to go to sleep.  Pt states pain is in middle of chest and non-radiating.  Pt also endorses cough x5-6 days that is productive with yellow/green phlegm.  Pt states chest pain is constant in nature.  Pt denies fevers, but endorses some chills.  Pt is otherwise A&O x4 and in NAD.

## 2022-10-30 NOTE — ED Provider Notes (Signed)
[facility] Provider Note    Event Date/Time   First MD Initiated Contact with Patient 10/30/22 0400     (approximate)   History   Chest Pain   HPI  CORNELIOUS Norton is a 32 y.o. male here with chest pain.  The patient states that over the last 2 days or so, he has had cough, aching, burning chest pain and some cough.  He said associated nasal congestion and sore throat.  He states his pain has been fairly worsening throughout the day today so presents evaluation.  No known history of coronary disease.  No history of blood clots.  No leg swelling.     Physical Exam   Triage Vital Signs: ED Triage Vitals  Enc Vitals Group     BP 10/30/22 0322 110/60     Pulse Rate 10/30/22 0322 83     Resp 10/30/22 0322 17     Temp 10/30/22 0322 98.6 F (37 C)     Temp Source 10/30/22 0322 Oral     SpO2 10/30/22 0322 96 %     Weight 10/30/22 0319 238 lb 1.6 oz (108 kg)     Height 10/30/22 0319 5\' 9"  (1.753 m)     Head Circumference --      Peak Flow --      Pain Score 10/30/22 0319 9     Pain Loc --      Pain Edu? --      Excl. in GC? --     Most recent vital signs: Vitals:   10/30/22 0345 10/30/22 0400  BP: 119/69 109/72  Pulse: 90 72  Resp: 20 19  Temp:    SpO2: 98% 98%     General: Awake, no distress.  CV:  Good peripheral perfusion.  Regular rate and rhythm.  No murmurs. Resp:  Normal work of breathing.  Slight expiratory wheezing.  Normal work of breathing.  Frequent coughing. Abd:  No distention.  No tenderness. Other:  No lower extremity swelling.   ED Results / Procedures / Treatments   Labs (all labs ordered are listed, but only abnormal results are displayed) Labs Reviewed  COMPREHENSIVE METABOLIC PANEL - Abnormal; Notable for the following components:      Result Value   Glucose, Bld 102 (*)    All other components within normal limits  SARS CORONAVIRUS 2 BY RT PCR  CBC  TROPONIN I (HIGH SENSITIVITY)  TROPONIN I (HIGH SENSITIVITY)      EKG Normal sinus rhythm, ventricular 81.  PR 152, QRS 88, QTc 399.  No acute ST elevation or depression or acute evidence of acute ischemia or infarct.   RADIOLOGY Chest x-ray: Clear   I also independently reviewed and agree with radiologist interpretations.   PROCEDURES:  Critical Care performed: No   MEDICATIONS ORDERED IN ED: Medications  ipratropium-albuterol (DUONEB) 0.5-2.5 (3) MG/3ML nebulizer solution 3 mL (3 mLs Nebulization Given 10/30/22 0508)  predniSONE (DELTASONE) tablet 60 mg (60 mg Oral Given 10/30/22 0504)  HYDROcodone-acetaminophen (NORCO/VICODIN) 5-325 MG per tablet 2 tablet (2 tablets Oral Given 10/30/22 0506)  ondansetron (ZOFRAN-ODT) disintegrating tablet 4 mg (4 mg Oral Given 10/30/22 0507)     IMPRESSION / MDM / ASSESSMENT AND PLAN / ED COURSE  I reviewed the triage vital signs and the nursing notes.                              Differential  diagnosis includes, but is not limited to, atypical msk chest pain, chest wall pain from coughing, PNA, PTX, ACS, gastritis  Patient's presentation is most consistent with acute presentation with potential threat to life or bodily function.  The patient is on the cardiac monitor to evaluate for evidence of arrhythmia and/or significant heart rate changes   32 yo M here with atypical chest pain with cough, sputum production. Clinically, suspect mild bronchitis vs early atypical PNA with chest wall pain. EKG is nonischemic and trop negative, do not suspect ACS. No tachycardia, tachypnea, hypoxia or signs of PE. Hgb is normal. Labs reassuring. He is not hypoxic. CXR is clear. Will tx for possible early CAP, give albuterol for mild component of bronchospasm which I suspect is related to his smoking. Return precautions given.   FINAL CLINICAL IMPRESSION(S) / ED DIAGNOSES   Final diagnoses:  Bronchitis  Atypical chest pain     Rx / DC Orders   ED Discharge Orders          Ordered    azithromycin (ZITHROMAX  Z-PAK) 250 MG tablet        10/30/22 0530    predniSONE (DELTASONE) 20 MG tablet  Daily        10/30/22 0530    ibuprofen (ADVIL) 600 MG tablet  Every 8 hours PRN        10/30/22 0530             Note:  This document was prepared using Dragon voice recognition software and may include unintentional dictation errors.   Shaune Pollack, MD 10/30/22 [phone]

## 2023-05-06 ENCOUNTER — Ambulatory Visit
Admission: EM | Admit: 2023-05-06 | Discharge: 2023-05-06 | Disposition: A | Discharge status: Home / Self Care | Payer: Self-pay | Attending: Physician Assistant | Admitting: Physician Assistant

## 2023-05-06 DIAGNOSIS — J209 Acute bronchitis, unspecified: Secondary | ICD-10-CM

## 2023-05-06 MED ORDER — PREDNISONE 20 MG PO TABS: 40.0000 mg | ORAL_TABLET | Freq: Every day | ORAL | 0 refills | Status: AC

## 2023-05-06 NOTE — ED Triage Notes (Signed)
"  I am having Flu like symptoms, I started with Cough, Congestion about 2 wks ago, about 3-4 days ago symptoms improved but I feel like I am a carried of the Flu or this illness and getting everyone else sick". No current Fever. No current Cough.

## 2023-05-07 ENCOUNTER — Encounter: Payer: Self-pay | Admitting: Physician Assistant

## 2023-05-07 NOTE — ED Provider Notes (Signed)
 [facility]    CSN: [id number] Arrival date & time: 05/06/23  1155      History   Chief Complaint Chief Complaint  Patient presents with   Cough   Sinus Problem   Headache    HPI Mark Norton is a 33 y.o. male.   Patient here today for evaluation of flulike symptoms.  He initially had cough and congestion about 2 weeks ago but 3 to 4 days ago symptoms started to seem to return.  He is unsure if he is a carrier of the flu because he feels like people around him are getting sick.  He does not have any current fever or current cough.  The history is provided by the patient.  Cough Associated symptoms: headaches   Associated symptoms: no chills, no ear pain, no eye discharge, no fever, no shortness of breath and no sore throat   Sinus Problem Associated symptoms include headaches. Pertinent negatives include no abdominal pain and no shortness of breath.  Headache Associated symptoms: congestion and cough   Associated symptoms: no abdominal pain, no ear pain, no fever, no nausea, no sore throat and no vomiting     History reviewed. No pertinent past medical history.  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 No pertinent surgical history.     Home Medications    Prior to Admission medications   Medication Sig Start Date End Date Taking? Authorizing Provider  predniSONE  (DELTASONE ) 20 MG tablet Take 2 tablets (40 mg total) by mouth daily with breakfast for 5 days. 05/06/23 05/11/23 Yes Billy Asberry FALCON, PA-C  amoxicillin -clavulanate (AUGMENTIN ) 875-125 MG tablet Take 1 tablet by mouth every 12 (twelve) hours. 08/21/19   Maranda Jamee Jacob, MD  azithromycin  (ZITHROMAX  Z-PAK) 250 MG tablet Take 2 tablets (500 mg) on   Day 1,  followed by 1 tablet (250 mg) once daily on Days 2 through 5. 10/30/22   Angelena Smalls, MD  cyclobenzaprine  (FLEXERIL ) 10 MG tablet Take 1 tablet (10 mg total) by mouth 2 (two) times daily as needed for muscle spasms. 05/13/19   Nivia Colon, PA-C  ibuprofen  (ADVIL ) 600 MG tablet Take 1 tablet (600 mg total) by mouth every 8 (eight) hours as needed for moderate pain. 10/30/22   Angelena Smalls, MD  imiquimod  (ALDARA ) 5 % cream Apply topically 3 (three) times a week. Apply every other day 3 times/week 10/05/19   Murphy Channel, FNP  diphenhydrAMINE  (BENADRYL ) 25 MG tablet Take 2 tablets (50 mg total) by mouth every 6 (six) hours as needed for itching. Patient not taking: Reported on 05/10/2016 12/05/15 05/13/19  Liu, Dana Duo, MD  fluticasone (FLONASE) 50 MCG/ACT nasal spray 1 spray daily. In each nostril   01/13/13  [provider]    Family History Family History  Problem Relation Age of Onset   Healthy Mother    Healthy Father     Social History Social History   Tobacco Use   Smoking status: Former    Types: Cigarettes    Passive exposure: Current   Smokeless tobacco: Never  Vaping Use   Vaping status: Never Used  Substance Use Topics   Alcohol use: Not Currently   Drug use: Never     Allergies   Blueberry flavoring agent (non-screening)  Review of Systems Review of Systems  Constitutional:  Negative for chills and fever.  HENT:  Positive for congestion. Negative for ear pain and sore throat.   Eyes:  Negative for discharge and redness.  Respiratory:  Positive for cough. Negative for shortness of breath.   Gastrointestinal:  Negative for abdominal pain, nausea and vomiting.  Neurological:  Positive for headaches.     Physical Exam Triage Vital Signs ED Triage Vitals  Encounter Vitals Group     BP 05/06/23 1227 102/63     Systolic BP Percentile --      Diastolic BP Percentile --      Pulse Rate 05/06/23 1227 84     Resp 05/06/23 1227 18     Temp  05/06/23 1227 98 F (36.7 C)     Temp Source 05/06/23 1227 Oral     SpO2 05/06/23 1227 99 %     Weight 05/06/23 1224 187 lb (84.8 kg)     Height 05/06/23 1224 5' 9 (1.753 m)     Head Circumference --      Peak Flow --      Pain Score 05/06/23 1222 0     Pain Loc --      Pain Education --      Exclude from Growth Chart --    No data found.  Updated Vital Signs BP 102/63 (BP Location: Left Arm)   Pulse 84   Temp 98 F (36.7 C) (Oral)   Resp 18   Ht 5' 9 (1.753 m)   Wt 187 lb (84.8 kg)   SpO2 99%   BMI 27.62 kg/m   Visual Acuity Right Eye Distance:   Left Eye Distance:   Bilateral Distance:    Right Eye Near:   Left Eye Near:    Bilateral Near:     Physical Exam Vitals and nursing note reviewed.  Constitutional:      General: He is not in acute distress.    Appearance: Normal appearance. He is not ill-appearing.  HENT:     Head: Normocephalic and atraumatic.     Right Ear: Tympanic membrane normal.     Left Ear: Tympanic membrane normal.     Nose: Congestion present.     Mouth/Throat:     Mouth: Mucous membranes are moist.     Pharynx: Oropharynx is clear. No oropharyngeal exudate or posterior oropharyngeal erythema.  Eyes:     Conjunctiva/sclera: Conjunctivae normal.  Cardiovascular:     Rate and Rhythm: Normal rate and regular rhythm.     Heart sounds: Normal heart sounds. No murmur heard. Pulmonary:     Effort: Pulmonary effort is normal. No respiratory distress.     Breath sounds: Normal breath sounds. No wheezing, rhonchi or rales.  Skin:    General: Skin is warm and dry.  Neurological:     Mental Status: He is alert.  Psychiatric:        Mood and Affect: Mood normal.        Thought Content: Thought content normal.      UC Treatments / Results  Labs (all labs ordered are listed, but only abnormal results are displayed) Labs Reviewed - No data to display  EKG   Radiology No results found.  Procedures Procedures (including critical  care time)  Medications Ordered in UC Medications - No data to display  Initial Impression / Assessment and Plan / UC Course  I have reviewed the triage vital signs and the nursing notes.  Pertinent labs & imaging results that were available during my care of the patient were reviewed by me and considered in my medical decision making (see chart for details).    Will treat to cover suspected bronchitis with steroid burst.  Advised follow-up if no gradual improvement with any further concerns.  Low suspicion for pneumonia given lack of fever.  Final Clinical Impressions(s) / UC Diagnoses   Final diagnoses:  Acute bronchitis, unspecified organism   Discharge Instructions   None    ED Prescriptions     Medication Sig Dispense Auth. Provider   predniSONE  (DELTASONE ) 20 MG tablet Take 2 tablets (40 mg total) by mouth daily with breakfast for 5 days. 10 tablet Billy Asberry FALCON, PA-C      PDMP not reviewed this encounter.   Billy Asberry FALCON, PA-C 05/07/23 1058

## 2023-06-02 ENCOUNTER — Emergency Department (HOSPITAL_COMMUNITY)
Admission: EM | Admit: 2023-06-02 | Discharge: 2023-06-02 | Discharge status: Against Medical Advice | Payer: Self-pay | Attending: Emergency Medicine | Admitting: Emergency Medicine

## 2023-06-02 ENCOUNTER — Encounter (HOSPITAL_COMMUNITY): Payer: Self-pay

## 2023-06-02 ENCOUNTER — Emergency Department (HOSPITAL_BASED_OUTPATIENT_CLINIC_OR_DEPARTMENT_OTHER)
Admission: EM | Admit: 2023-06-02 | Discharge: 2023-06-02 | Discharge status: Against Medical Advice | Payer: Self-pay | Attending: Emergency Medicine | Admitting: Emergency Medicine

## 2023-06-02 ENCOUNTER — Emergency Department (HOSPITAL_COMMUNITY)
Admission: EM | Admit: 2023-06-02 | Discharge: 2023-06-02 | Discharge status: Against Medical Advice | Payer: Self-pay | Source: Home / Self Care

## 2023-06-02 ENCOUNTER — Encounter (HOSPITAL_BASED_OUTPATIENT_CLINIC_OR_DEPARTMENT_OTHER): Payer: Self-pay | Admitting: Emergency Medicine

## 2023-06-02 ENCOUNTER — Other Ambulatory Visit: Payer: Self-pay

## 2023-06-02 DIAGNOSIS — J101 Influenza due to other identified influenza virus with other respiratory manifestations: Secondary | ICD-10-CM | POA: Insufficient documentation

## 2023-06-02 DIAGNOSIS — R0602 Shortness of breath: Secondary | ICD-10-CM | POA: Insufficient documentation

## 2023-06-02 DIAGNOSIS — Z5321 Procedure and treatment not carried out due to patient leaving prior to being seen by health care provider: Secondary | ICD-10-CM | POA: Insufficient documentation

## 2023-06-02 DIAGNOSIS — J111 Influenza due to unidentified influenza virus with other respiratory manifestations: Secondary | ICD-10-CM | POA: Insufficient documentation

## 2023-06-02 DIAGNOSIS — Z20822 Contact with and (suspected) exposure to covid-19: Secondary | ICD-10-CM | POA: Insufficient documentation

## 2023-06-02 DIAGNOSIS — R1110 Vomiting, unspecified: Secondary | ICD-10-CM | POA: Insufficient documentation

## 2023-06-02 DIAGNOSIS — M791 Myalgia, unspecified site: Secondary | ICD-10-CM | POA: Insufficient documentation

## 2023-06-02 LAB — COMPREHENSIVE METABOLIC PANEL
ALT: 22 U/L (ref 0–44)
AST: 35 U/L (ref 15–41)
Albumin: 4.1 g/dL (ref 3.5–5.0)
Alkaline Phosphatase: 46 U/L (ref 38–126)
Anion gap: 14 (ref 5–15)
BUN: 9 mg/dL (ref 6–20)
CO2: 23 mmol/L (ref 22–32)
Calcium: 9.3 mg/dL (ref 8.9–10.3)
Chloride: 99 mmol/L (ref 98–111)
Creatinine, Ser: 1.13 mg/dL (ref 0.61–1.24)
GFR, Estimated: >60 mL/min (ref >60)
Glucose, Bld: 107 mg/dL — ABNORMAL HIGH (ref 70–99)
Potassium: 4 mmol/L (ref 3.5–5.1)
Sodium: 136 mmol/L (ref 135–145)
Total Bilirubin: 0.5 mg/dL (ref 0.0–1.2)
Total Protein: 7.1 g/dL (ref 6.5–8.1)

## 2023-06-02 LAB — RESP PANEL BY RT-PCR (RSV, FLU A&B, COVID)  RVPGX2
Influenza A by PCR: POSITIVE — AB
Influenza B by PCR: NEGATIVE
Resp Syncytial Virus by PCR: NEGATIVE
SARS Coronavirus 2 by RT PCR: NEGATIVE

## 2023-06-02 LAB — CBC WITH DIFFERENTIAL/PLATELET
Abs Immature Granulocytes: 0.02 10*3/uL (ref 0.00–0.07)
Basophils Absolute: 0 10*3/uL (ref 0.0–0.1)
Basophils Relative: 1 %
Eosinophils Absolute: 0.2 10*3/uL (ref 0.0–0.5)
Eosinophils Relative: 4 %
HCT: 46.3 % (ref 39.0–52.0)
Hemoglobin: 15.8 g/dL (ref 13.0–17.0)
Immature Granulocytes: 1 %
Lymphocytes Relative: 42 %
Lymphs Abs: 1.7 10*3/uL (ref 0.7–4.0)
MCH: 30.3 pg (ref 26.0–34.0)
MCHC: 34.1 g/dL (ref 30.0–36.0)
MCV: 88.9 fL (ref 80.0–100.0)
Monocytes Absolute: 0.8 10*3/uL (ref 0.1–1.0)
Monocytes Relative: 19 %
Neutro Abs: 1.3 10*3/uL — ABNORMAL LOW (ref 1.7–7.7)
Neutrophils Relative %: 33 %
Platelets: 290 10*3/uL (ref 150–400)
RBC: 5.21 MIL/uL (ref 4.22–5.81)
RDW: 14.5 % (ref 11.5–15.5)
WBC: 4 10*3/uL (ref 4.0–10.5)
nRBC: 0 % (ref 0.0–0.2)

## 2023-06-02 LAB — LIPASE, BLOOD: Lipase: 35 U/L (ref 11–51)

## 2023-06-02 NOTE — ED Provider Triage Note (Signed)
 Emergency Medicine Provider Triage Evaluation Note  Mark Norton , a 33 y.o. male  was evaluated in triage.  Pt complains of generalized abdominal pain, fevers, body aches, for past 2 days.  Feels like he has not been eating and drinking well.  Last bowel movement 2 days ago.  Review of Systems  Positive: As above Negative: As above  Physical Exam  BP 121/84 (BP Location: Right Arm)   Pulse 92   Temp 98.9 F (37.2 C) (Oral)   Resp (!) 22   Wt 81.6 kg   SpO2 98%   BMI 26.58 kg/m  Gen:   Awake, no distress  laying on floor of room Resp:  Normal effort  MSK:   Moves extremities without difficulty    Medical Decision Making  Medically screening exam initiated at 2:08 PM.  Appropriate orders placed.  Mark Norton was informed that the remainder of the evaluation will be completed by another provider, this initial triage assessment does not replace that evaluation, and the importance of remaining in the ED until their evaluation is complete.  I offered patient Zofran  for nausea, he declines at this time.   Veta Palma, PA-C 06/02/23 1409

## 2023-06-02 NOTE — ED Triage Notes (Signed)
Flu+ (swab done at [facility]) Body aches Symptoms started 2 days ago

## 2023-06-02 NOTE — ED Notes (Signed)
[facility] called and said pt was checking in there.

## 2023-06-02 NOTE — ED Triage Notes (Signed)
Pt c/o emesis, fevers, sob, and generalized body aches x 2 days; recently traveled to [place]; not taking any meds  Verbal consent given for mse

## 2023-09-11 ENCOUNTER — Ambulatory Visit: Payer: Self-pay

## 2023-09-15 ENCOUNTER — Ambulatory Visit: Payer: Self-pay

## 2023-09-16 ENCOUNTER — Ambulatory Visit: Payer: Self-pay

## 2023-09-25 ENCOUNTER — Ambulatory Visit: Payer: Self-pay | Admitting: Nurse Practitioner

## 2023-09-25 DIAGNOSIS — Z202 Contact with and (suspected) exposure to infections with a predominantly sexual mode of transmission: Secondary | ICD-10-CM

## 2023-09-25 LAB — GRAM STAIN

## 2023-09-25 LAB — HM HEPATITIS C SCREENING LAB: HM Hepatitis Screen: NEGATIVE

## 2023-09-25 LAB — HM HIV SCREENING LAB: HM HIV Screening: NEGATIVE

## 2023-09-25 MED ORDER — METRONIDAZOLE 500 MG PO TABS
2000.0000 mg | ORAL_TABLET | Freq: Once | ORAL | Status: AC
Start: 2023-09-25 — End: 2023-09-25

## 2023-09-25 MED ORDER — DOXYCYCLINE HYCLATE 100 MG PO TABS: 100.0000 mg | ORAL_TABLET | Freq: Two times a day (BID) | ORAL | Status: AC

## 2023-09-25 NOTE — Progress Notes (Unsigned)
 [facility] Department STI clinic [address], [address] [place] [address] Main phone: [phone]  STI screening visit  Subjective:  Mark Norton is a 33 y.o. male being seen today for an STI screening visit. The patient reports they do have symptoms.    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 He reports a painful bump in his groin 1 week ago. He has also recently felt a bump close to his rectum. He has had 2 male partners in the last 2 months that he uses a condom with sometimes. One of his partners reported to him that she was positive for trich and ureaplasma.  See flowsheet for further details and programmatic requirements  Hyperlink available at the top of the signed note in blue.  Flow sheet content below:  Pregnancy Intention Screening Does the patient want to become pregnant in the next year?: No Does the patient's partner want to become pregnant in the next year?: No Would the patient like to discuss contraceptive options today?: No All Patients Anyone smoke around pt and/or pt's children?: Yes Anyone smoke inside pt's house?: Yes Anyone smoke inside car?: Yes Anyone smoke inside the workplace?: No Reason For STD Screen STD Screening: Has symptoms Have you ever had an STD?: Yes History of Antibiotic use in the past 2 weeks?: No STD Symptoms Lower abdominal pain: Yes Genital ulcer / lesion: Yes Risk Factors for Hep B Household, sexual, or needle  sharing contact of a person infected with Hep B: No Sexual contact with a person who uses drugs not as prescribed?: No Currently or Ever used drugs not as prescribed: No HIV Positive: No PRep Patient: No Men who have sex with men: No Have Hepatitis C: No History of Incarceration: Yes History of Homeslessness?: No Anal sex following anal drug use?: No Risk Factors for Hep C Currently using drugs not as prescribed: No Sexual partner(s) currently using drugs as not prescribed: No History of drug use: No HIV Positive: No People with a history of incarceration: Yes People born between the years of 34 and 19: No Hepatitis Counseling Hep B Counseling: Counseled patient about increased risk of Hep B and recommendation for testing, Patient accepts testing for Hep B today Hep C Counseling: Counseled patient about increased risk of Hep C and recommendation for testing, Patient accepts testing for Hep C today Advise Advised client to quit or stay quit. : Yes Assess Is patient ready to quit in next 30 days?: No Abuse History Has patient ever been abused physically?: No Has patient ever been abused sexually?: No Does patient feel they have a problem with Anxiety?: No Does patient feel they have a problem with Depression?: No Referral to Behavioral Health: No Counseling Medication side effects discussed with patient?: Yes Contact card(s) given to patient: No Patient counseled to abstain from sex for: 7 days Patient counseled to abstain from sex for?: 7 days post partner's treatment Patient counseled to avoid alcohol for?: 5 days Patient counseled to use condoms with all sex: Condoms given RTC  in 2-3 weeks for test results: Yes Clinic will call if test results abnormal before test result appt.: Yes Report card filled out: No Patient to return to the clinic in 3 months for TOC: Yes Test results given to patient Patient counseled to use condoms with all sex: Condoms given STD  Treatment Patient counseled to abstain from sex for: 7 days Patient counseled to abstain from sex for?: 7 days post partner's treatment Patient counseled to avoid alcohol for?: 5 days Nursing Actions: Patient treated per S.O., Patient should RTC for re-treatment if vomits within 2 hours after taking meds., Patient to RTC in 3 months for [facility]., Patient counseled to use condoms with all sex - Condoms given  Screening for MPX risk: Does the patient have an unexplained rash? No Is the patient MSM? No Does the patient endorse multiple sex partners or anonymous sex partners? No Did the patient have close or sexual contact with a person diagnosed with MPX? No Has the patient traveled outside the [place]  where MPX is endemic? No Is there a high clinical suspicion for MPX-- evidenced by one of the following No  -Unlikely to be chickenpox  -Lymphadenopathy  -Rash that present in same phase of evolution on any given body part  STI screening history: Last HIV test per patient/review of record was  Lab Results  Component Value Date   HMHIVSCREEN Negative - Validated 10/11/2019   No results found for: "HIV"  Last HEPC test per patient/review of record was  Lab Results  Component Value Date   HMHEPCSCREEN Negative-Validated 10/11/2019   No components found for: "HEPC"   Last HEPB test per patient/review of record was No components found for: "HMHEPBSCREEN"   Fertility: Does the patient or their partner desires a pregnancy in the next year? No  Immunization History  Administered Date(s) Administered   DTP 08/04/2006   HPV 9-valent 10/04/2019   Hepatitis A 08/04/2006   Meningococcal polysaccharide vaccine (MPSV4) 08/04/2006   Tdap 10/23/2013    The following portions of the patient's history were reviewed and updated as appropriate: allergies, current medications, past medical history, past social history, past surgical history and problem list.  Objective:  There were no vitals filed for this  visit.  Physical Exam Exam conducted with a chaperone present [facility]).  Constitutional:      Appearance: Normal appearance.  HENT:     Head: Normocephalic and atraumatic.     Comments: No nits or hair loss    Mouth/Throat:     Mouth: Mucous membranes are moist. No oral lesions.     Pharynx: Oropharynx is clear. No oropharyngeal exudate or posterior oropharyngeal erythema.  Eyes:     General:        Right eye: No discharge.        Left eye: No discharge.     Conjunctiva/sclera:     Right eye: Right conjunctiva is not injected. No exudate.    Left eye: Left conjunctiva is not injected. No exudate. Pulmonary:     Effort: Pulmonary effort is normal.  Abdominal:     General: Abdomen is flat.     Palpations: Abdomen is soft. There is no hepatomegaly or mass.     Tenderness: There is no abdominal tenderness. There is no rebound.     Hernia: There is no hernia in the left inguinal area or right inguinal area.  Genitourinary:    Pubic Area: No rash or pubic lice (no nits).      Penis: Normal. No  tenderness, discharge, swelling or lesions.      Testes: Normal.     Epididymis:     Right: Normal. No mass or tenderness.     Left: Normal. No mass or tenderness.     Rectum: Normal. No tenderness (no lesions or discharge).     Comments: Penile swab collected Lymphadenopathy:     Head:     Right side of head: No preauricular or posterior auricular adenopathy.     Left side of head: No preauricular or posterior auricular adenopathy.     Cervical: No cervical adenopathy.     Upper Body:     Right upper body: No supraclavicular, axillary or epitrochlear adenopathy.     Left upper body: No supraclavicular, axillary or epitrochlear adenopathy.     Lower Body: No right inguinal adenopathy. No left inguinal adenopathy.  Skin:    General: Skin is warm and dry.     Findings: No rash.     Comments: ~1cm raised pink blister on right inner thigh Small less than 1 cm abrasion near rectum   Neurological:     Mental Status: He is alert and oriented to person, place, and time.      Assessment and Plan:  Mark Norton is a 33 y.o. male presenting to the [facility] Department for STI screening  1. Exposure to STD (Primary) Pt reports that partner is positive for trich and ureaplasma. He desires treatment and screening for everything. Consulted with Dr. Tanis Fan about treatment for ureaplasma. She recommends doxy BID for 7 days. + for NGU Bumps on thigh and rectum most consistent with foliculitis (pt notes that he shaves). Recommended warm compresses. - metroNIDAZOLE (FLAGYL) 500 MG tablet; Take 4 tablets (2,000 mg total) by mouth once for 1 dose. - Syphilis Serology, [facility] - HIV/HCV [facility] - HBV Antigen/Antibody [facility] - Chlamydia/GC NAA, Confirmation - Gram stain - doxycycline (VIBRA-TABS) 100 MG tablet; Take 1 tablet (100 mg total) by mouth 2 (two) times daily for 7 days.   Patient does have STI symptoms Patient accepted the following screenings: HIV, RPR, Hep B, and Hep C, gram stain, GC/CT urine Patient meets criteria for HepB screening? Yes. Ordered? yes Patient meets criteria for HepC screening? Yes. Ordered? yes Recommended condom use with all sex Discussed importance of condom use for STI prevention  Treat positive test results per standing order. Discussed time line for State Lab results and that patient will be called with positive results and encouraged patient to call if he had not heard in 2 weeks Recommended repeat testing in 3 months with positive results. Recommended returning for continued or worsening symptoms.   Pt also endorses smoking and was given Quitline info. Return if symptoms worsen or fail to improve.  No future appointments.  Chantavia Bazzle K Achaia Garlock, NP

## 2023-09-25 NOTE — Progress Notes (Signed)
 The patient was dispensed #14 doxycycline today. I provided counseling today regarding the medication. We discussed the medication, the side effects and when to call clinic. Patient given the opportunity to ask questions. Questions answered.  Caren Channel, RN

## 2023-09-25 NOTE — Progress Notes (Signed)
 Pt is here for std screening.  Treated as contact to trich per standing order. The patient was dispensed metronidazole#4  today. I provided counseling today regarding the medication. We discussed the medication, the side effects and when to call clinic. Patient given the opportunity to ask questions. Questions answered.  Caren Channel, RN

## 2023-09-28 LAB — CHLAMYDIA/GC NAA, CONFIRMATION
Chlamydia trachomatis, NAA: NEGATIVE
Neisseria gonorrhoeae, NAA: NEGATIVE

## 2023-09-30 LAB — HBV ANTIGEN/ANTIBODY STATE LAB
Hep B Core Total Ab: NONREACTIVE
Hep B S Ab: NONREACTIVE
Hepatitis B Surface Antigen: NONREACTIVE

## 2023-10-05 ENCOUNTER — Encounter: Payer: Self-pay | Admitting: Nurse Practitioner

## 2023-10-16 ENCOUNTER — Ambulatory Visit
Admission: EM | Admit: 2023-10-16 | Discharge: 2023-10-16 | Disposition: A | Discharge status: Home / Self Care | Payer: Self-pay

## 2023-10-16 DIAGNOSIS — L02415 Cutaneous abscess of right lower limb: Secondary | ICD-10-CM

## 2023-10-16 MED ORDER — LIDOCAINE HCL (PF) 1 % IJ SOLN
10.0000 mL | Freq: Once | INTRAMUSCULAR | Status: AC
  Administered 2023-10-16: 10 mL via INTRADERMAL

## 2023-10-16 MED ORDER — SULFAMETHOXAZOLE-TRIMETHOPRIM 800-160 MG PO TABS: 1.0000 | ORAL_TABLET | Freq: Two times a day (BID) | ORAL | 0 refills | Status: AC

## 2023-10-16 NOTE — ED Triage Notes (Signed)
 I have a cyst on my inner right thigh, I need it looked at and drained. No fever.

## 2023-10-16 NOTE — ED Provider Notes (Signed)
 [facility]    CSN: [id number] Arrival date & time: 10/16/23  1255      History   Chief Complaint Chief Complaint  Patient presents with   Skin Problem    HPI Mark Norton is a 33 y.o. male.  Patient without significant medical history presents to the urgent care with concerns of a skin.  He reports that he noticed a cyst/abscess to the right inner thigh about 3 days ago.  Denies any drainage coming from the area.  No reported fever, chills or bodyaches.  Denies any significant warmth to the skin but does endorse pain over this area.  No prior history of similar findings.  HPI  History reviewed. No pertinent past medical history.  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 Authorizing Provider  doxycycline  (VIBRAMYCIN ) 100 MG capsule Take 100 mg by mouth 2 (two) times daily. 07/27/23  Yes [provider]  sulfamethoxazole -trimethoprim  (BACTRIM  DS) 800-160 MG tablet Take 1 tablet by mouth 2 (two) times daily for 7 days. 10/16/23 10/23/23 Yes Maily Debarge A, PA-C  amoxicillin -clavulanate (AUGMENTIN ) 875-125 MG tablet Take 1 tablet by mouth every 12 (twelve) hours. 08/21/19   Stephany Ehrich, MD  azithromycin  (ZITHROMAX  Z-PAK) 250 MG tablet Take 2 tablets (500 mg) on  Day 1,  followed by 1 tablet (250 mg) once daily on Days 2 through 5. 10/30/22   Loman Risk, MD  cyclobenzaprine  (FLEXERIL ) 10 MG tablet Take 1 tablet (10 mg total) by mouth 2 (two) times daily as needed for muscle spasms. 05/13/19   Debbra Fairy, PA-C  ibuprofen  (ADVIL ) 600 MG tablet Take  1 tablet (600 mg total) by mouth every 8 (eight) hours as needed for moderate pain. 10/30/22   Loman Risk, MD  imiquimod  (ALDARA ) 5 % cream Apply topically 3 (three) times a week. Apply every other day 3 times/week 10/05/19   Reeves Canter, FNP  diphenhydrAMINE  (BENADRYL ) 25 MG tablet Take 2 tablets (50 mg total) by mouth every 6 (six) hours as needed for itching. Patient not taking: Reported on 05/10/2016 12/05/15 05/13/19  Liu, Dana Duo, MD  fluticasone (FLONASE) 50 MCG/ACT nasal spray 1 spray daily. In each nostril   01/13/13  [provider]    Family History Family History  Problem Relation Age of Onset   Healthy Mother    Healthy Father     Social History Social History   Tobacco Use   Smoking status: Former    Types: Cigarettes    Passive exposure: Current   Smokeless tobacco: Never  Vaping Use   Vaping status: Never Used  Substance Use Topics   Alcohol use: Not Currently   Drug use: Never     Allergies   Blueberry flavoring agent (non-screening)   Review of Systems Review of Systems  Skin:  Positive for wound.  All other systems reviewed and are negative.    Physical Exam Triage Vital Signs ED Triage Vitals  Encounter Vitals Group     BP 10/16/23 1304 98/66     Girls Systolic BP Percentile --  Girls Diastolic BP Percentile --      Boys Systolic BP Percentile --      Boys Diastolic BP Percentile --      Pulse Rate 10/16/23 1304 94     Resp 10/16/23 1304 18     Temp 10/16/23 1304 98.1 F (36.7 C)     Temp Source 10/16/23 1304 Oral     SpO2 10/16/23 1304 97 %     Weight 10/16/23 1302 190 lb (86.2 kg)     Height 10/16/23 1302 5' 9 (1.753 m)     Head Circumference --      Peak Flow --      Pain Score 10/16/23 1300 6     Pain Loc --      Pain Education --      Exclude from Growth Chart --    No data found.  Updated Vital Signs BP 98/66 (BP Location: Left Arm)   Pulse 94   Temp 98.1 F (36.7 C) (Oral)   Resp 18   Ht 5' 9 (1.753 m)    Wt 190 lb (86.2 kg)   SpO2 97%   BMI 28.06 kg/m   Visual Acuity Right Eye Distance:   Left Eye Distance:   Bilateral Distance:    Right Eye Near:   Left Eye Near:    Bilateral Near:     Physical Exam Vitals and nursing note reviewed.  Constitutional:      General: He is not in acute distress.    Appearance: He is well-developed.  HENT:     Head: Normocephalic and atraumatic.   Eyes:     Conjunctiva/sclera: Conjunctivae normal.    Cardiovascular:     Rate and Rhythm: Normal rate and regular rhythm.     Heart sounds: No murmur heard. Pulmonary:     Effort: Pulmonary effort is normal. No respiratory distress.     Breath sounds: Normal breath sounds.  Abdominal:     Palpations: Abdomen is soft.     Tenderness: There is no abdominal tenderness.   Musculoskeletal:        General: No swelling.     Cervical back: Neck supple.   Skin:    General: Skin is warm and dry.     Capillary Refill: Capillary refill takes less than 2 seconds.     Comments: 2x2cm abscess with fluctuance to the right inner thigh. No active drainage. Minimal erythema or surrounding skin induration.   Neurological:     Mental Status: He is alert.   Psychiatric:        Mood and Affect: Mood normal.      UC Treatments / Results  Labs (all labs ordered are listed, but only abnormal results are displayed) Labs Reviewed - No data to display  EKG   Radiology No results found.  Procedures Incision and Drainage  Date/Time: 10/16/2023 3:40 PM  Performed by: Teller Wakefield A, PA-C Authorized by: Butch Otterson A, PA-C   Consent:    Consent obtained:  Verbal   Consent given by:  Patient   Risks discussed:  Bleeding and incomplete drainage   Alternatives discussed:  No treatment Universal protocol:    Patient identity confirmed:  Arm band, verbally with patient and provided demographic data Location:    Type:  Abscess   Size:  2x2cm   Location:  Lower extremity   Lower extremity  location:  Leg   Leg location:  R upper leg Pre-procedure details:    Skin preparation:  Chlorhexidine Sedation:    Sedation type:  None Anesthesia:    Anesthesia method:  Local infiltration   Local anesthetic:  Lidocaine  1% w/o epi Procedure type:    Complexity:  Simple Procedure details:    Ultrasound guidance: no     Incision types:  Stab incision   Wound management:  Probed and deloculated, irrigated with saline, extensive cleaning and debrided   Drainage:  Purulent   Drainage amount:  Moderate   Wound treatment:  Wound left open   Packing materials:  None Post-procedure details:    Procedure completion:  Tolerated  (including critical care time)  Medications Ordered in UC Medications  lidocaine  (PF) (XYLOCAINE ) 1 % injection 10 mL (10 mLs Intradermal Given 10/16/23 1625)    Initial Impression / Assessment and Plan / UC Course  I have reviewed the triage vital signs and the nursing notes.  Pertinent labs & imaging results that were available during my care of the patient were reviewed by me and considered in my medical decision making (see chart for details).  This patient presents to the ED for concern of skin problem. Differential diagnosis includes cellulitis, abscess, testicular pain, ingrown hair.   Medicines ordered and prescription drug management:  I ordered medication including lidocaine   for anesthesia  Reevaluation of the patient after these medicines showed that the patient improved I have reviewed the patients home medicines and have made adjustments as needed   Problem List / ED Course:  Patient presented to the ED with concerns of a skin problem. He reports this has been ongoing for the last several days but no draining from the wound. No reported fever, chills, or bodyaches. On exam, patient has a 2 x 2 cm fluctuant area at the right upper inner thigh.  Fluctuance is noted.  Consent obtained for incision and drainage. Patient tolerated local  anesthesia with incision and drainage performed.  There was a scant to moderate amount of drainage that was released which was primarily purulent in nature.  Area was deloculated and irrigated.  No packing was placed given minimal depth to the wound.  Given area surrounding skin induration and some erythema present, will start patient on Bactrim  DS twice daily for the next 7 days.  Return precautions discussed such as concerns for new or worsening symptoms.  Has been packed, wound recheck is not needed however could be beneficial if he is having complication with wound healing.  Patiently stable at this time for outpatient follow-up discharged home.  Final Clinical Impressions(s) / UC Diagnoses   Final diagnoses:  Abscess of right thigh     Discharge Instructions      You were seen today for concerns of an abscess. This was incised and drained here in the urgent care for you. Given the location of the abscess with some skin changes to suggest possible cellulitis, I am starting you on Bactrim  DS which you will take twice daily for the next 7 days. For any concerns of worsening abscess or infection, seek medical evaluation.     ED Prescriptions     Medication Sig Dispense Auth. Provider   sulfamethoxazole -trimethoprim  (BACTRIM  DS) 800-160 MG tablet Take 1 tablet by mouth 2 (two) times daily for 7 days. 14 tablet Rodger Giangregorio A, PA-C      PDMP not reviewed this encounter.   Sienna Stonehocker A, PA-C 10/16/23 [phone]

## 2023-10-16 NOTE — Discharge Instructions (Signed)
 You were seen today for concerns of an abscess. This was incised and drained here in the urgent care for you. Given the location of the abscess with some skin changes to suggest possible cellulitis, I am starting you on Bactrim  DS which you will take twice daily for the next 7 days. For any concerns of worsening abscess or infection, seek medical evaluation.

## 2023-10-23 ENCOUNTER — Ambulatory Visit: Payer: Self-pay | Admitting: Family Medicine

## 2023-10-23 DIAGNOSIS — Z113 Encounter for screening for infections with a predominantly sexual mode of transmission: Secondary | ICD-10-CM

## 2023-10-23 DIAGNOSIS — B353 Tinea pedis: Secondary | ICD-10-CM

## 2023-10-23 DIAGNOSIS — B356 Tinea cruris: Secondary | ICD-10-CM | POA: Insufficient documentation

## 2023-10-23 DIAGNOSIS — N341 Nonspecific urethritis: Secondary | ICD-10-CM | POA: Insufficient documentation

## 2023-10-23 DIAGNOSIS — R369 Urethral discharge, unspecified: Secondary | ICD-10-CM | POA: Insufficient documentation

## 2023-10-23 LAB — HM HIV SCREENING LAB: HM HIV Screening: NEGATIVE

## 2023-10-23 LAB — GRAM STAIN

## 2023-10-23 MED ORDER — DOXYCYCLINE HYCLATE 100 MG PO TABS: 100.0000 mg | ORAL_TABLET | Freq: Two times a day (BID) | ORAL | Status: AC

## 2023-10-23 MED ORDER — CLOTRIMAZOLE 1 % EX CREA: 1.0000 | TOPICAL_CREAM | Freq: Two times a day (BID) | CUTANEOUS | Status: AC

## 2023-10-23 NOTE — Assessment & Plan Note (Signed)
 Clear penile d/c, +WBC on gram stain. Will treat as NGU with doxycycline  100 mg BID x 7 days.

## 2023-10-23 NOTE — Progress Notes (Addendum)
 Pt is here for std screening.  The patient was dispensed #14 doxycycline  and clotrimazole topical#1 today. I provided counseling today regarding the medication. We discussed the medication, the side effects and when to call clinic. Patient given the opportunity to ask questions. Questions answered.  Larraine JONELLE Northern, RN

## 2023-10-23 NOTE — Patient Instructions (Signed)
 STI screening - Today we obtained a urine sample to screen for gonorrhea and chlamydia and penile swab to screen for gonorrhea - We also obtained a blood sample to screen for HIV and syphilis - If the results are abnormal, I will give you a call.    Estimated time frame for results collected at the [facility] Department: Same day Trichomonas Yeast BV (bacterial vaginosis)  Within 1-2 weeks Gonorrhea Chlamydia  Within 2-3 weeks HIV Syphilis Hepatitis B Hepatitis C

## 2023-10-23 NOTE — Progress Notes (Addendum)
 [facility] STI clinic [address] [place] [address] Main phone: [phone]  STI screening visit  Subjective:  Mark Norton is a 33 y.o. male being seen today for an STI screening visit. The patient reports they do have symptoms.    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 Also clear penile discharge, unclear duration, but possibly 3 days. 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 No known STI exposure.   Was a contact to NGU about a month ago. Treated with doxycycline , but he was only able to finish 3-4 days of treatment.   See flowsheet for further details and programmatic requirements  Hyperlink available at the top of the signed note in blue.  Flow sheet content below:  Pregnancy Intention Screening Does the patient want to become pregnant in the next year?: No Does the patient's partner want to become pregnant in the next year?: No Would the patient like to discuss contraceptive options today?: No All Patients Anyone smoke around pt and/or pt's children?: Yes Anyone smoke inside pt's house?: Yes Anyone smoke inside car?: Yes Reason For STD Screen STD Screening: Has symptoms Have you ever had an STD?: Yes History of Antibiotic use in the past 2 weeks?: No STD Symptoms Genital Itching: Yes Lower abdominal pain: No Discharge:  Yes Dysuria: No Genital ulcer / lesion: No Rash: Yes Oral / Other skin ulcer: No Risk Factors for Hep B Household, sexual, or needle sharing contact of a person infected with Hep B: No Sexual contact with a person who uses drugs not as prescribed?: No Currently or Ever used drugs not as prescribed: No HIV Positive: No PRep Patient: No Men who have sex with men: No Have Hepatitis C: No History of Incarceration: No History of Homeslessness?: No Anal sex following anal drug use?: N/A Risk Factors for Hep C Currently using drugs not as prescribed: No Sexual partner(s) currently using drugs as not prescribed: No History of drug use: No HIV Positive: No People with a history of incarceration: No People born between the years of 8 and 35: No Abuse History Has patient ever been abused physically?: No Has patient ever been abused sexually?: No Does patient feel they have a problem with Anxiety?: No Does patient feel they have a problem with Depression?: No Referral to Behavioral Health: No Counseling Medication side effects discussed with patient?: Yes Contact card(s) given to patient: Yes Patient counseled to abstain from sex for: 14 days Patient counseled to abstain from sex for?: 7 days post partner's treatment Patient counseled to use condoms with all sex: Condoms declined RTC in 2-3 weeks for test results: Yes Clinic will call if test results abnormal before test result appt.: Yes Report card filled out: No Patient should return to the clinic for re-treatment if vomits within 2 hours after taking meds   : Yes Patient to return to the clinic in 3 months for TOC: Yes  BCM given today through [facility] clinic: No Immunizations: Referred Test results given to patient Patient counseled to use condoms with all sex: Condoms declined STD Treatment Patient counseled to abstain from sex for: 14 days Patient counseled to abstain from sex for?: 7 days post partner's treatment  Screening  for MPX risk: Does the patient have an unexplained rash? Yes Is the patient MSM? No Does the patient endorse multiple sex partners or anonymous sex partners? No Did the patient have close or sexual contact with a person diagnosed with MPX? No Has the patient traveled outside the [place]  where MPX is endemic? No Is there a high clinical suspicion for MPX-- evidenced by one of the following No  -Unlikely to be chickenpox  -Lymphadenopathy  -Rash that present in same phase of evolution on any given body part  STI screening history: Last HIV test per patient/review of record was  Lab Results  Component Value Date   HMHIVSCREEN Negative - Validated 09/25/2023   No results found for: HIV  Last HEPC test per patient/review of record was  Lab Results  Component Value Date   HMHEPCSCREEN Negative-Validated 09/25/2023   No components found for: HEPC   Last HEPB test per patient/review of record was No components found for: HMHEPBSCREEN   Fertility: Does the patient or their partner desires a pregnancy in the next year? No  Immunization History  Administered Date(s) Administered   DTP 08/04/2006   HPV 9-valent 10/04/2019   Hepatitis A 08/04/2006   Meningococcal polysaccharide vaccine (MPSV4) 08/04/2006   Tdap 10/23/2013    The following portions of the patient's history were reviewed and updated as appropriate: allergies, current medications, past medical history, past social history, past surgical history and problem list.  Objective:  There were no vitals filed for this visit.  Physical Exam Exam conducted with a chaperone present Brett Orange CNA).  Constitutional:      Appearance: Normal appearance.  HENT:     Head: Normocephalic and atraumatic.     Comments: No nits or hair loss    Mouth/Throat:     Mouth: Mucous membranes are moist. No oral lesions.     Pharynx: Oropharynx is clear. No oropharyngeal exudate or posterior oropharyngeal erythema.   Eyes:     General: No  scleral icterus.       Right eye: No discharge.        Left eye: No discharge.     Conjunctiva/sclera:     Right eye: Right conjunctiva is not injected. No exudate.    Left eye: Left conjunctiva is not injected. No exudate.  Pulmonary:     Effort: Pulmonary effort is normal.  Abdominal:     Palpations: There is no hepatomegaly.     Hernia: There is no hernia in the left inguinal area or right inguinal area.  Genitourinary:    Pubic Area: Rash present. No pubic lice (no nits).      Penis: Discharge present. No tenderness, swelling or lesions.      Testes: Normal.        Right: Mass, tenderness or swelling not present.        Left: Mass, tenderness or swelling not present.     Epididymis:     Right: Normal. No mass or tenderness.     Left: Normal. No mass or tenderness.     Tanner stage (genital): 5.     Rectum: Tenderness: no lesions or discharge.     Comments: Penile Discharge Amount: scant Color:  clear  Musculoskeletal:     Cervical back: Neck supple. No rigidity or tenderness.  Lymphadenopathy:     Head:     Right side of head: No preauricular or posterior auricular adenopathy.     Left side of head: No preauricular or posterior auricular adenopathy.     Cervical: No cervical adenopathy.     Upper Body:     Right upper body: No supraclavicular adenopathy.     Left upper body: No supraclavicular adenopathy.     Lower Body: No right inguinal adenopathy. No left inguinal adenopathy.   Skin:    General: Skin is warm and dry.     Capillary Refill: Capillary refill takes less than 2 seconds.     Coloration: Skin is not jaundiced or pale.     Findings: No bruising, erythema, lesion or rash.   Neurological:     General: No focal deficit present.     Mental Status: He is alert and oriented to person, place, and time.   Psychiatric:        Mood and Affect: Mood normal.        Behavior: Behavior normal.    Assessment and Plan:  Mark Norton is a 33 y.o. male presenting  to the [facility] Department for STI screening  Screening examination for venereal disease -     Chlamydia/GC NAA, Confirmation -     Chlamydia/Gonorrhea [facility] -     HIV [facility] -     Syphilis Serology, [facility]  Penile discharge -     Gram stain  Jock itch -     Clotrimazole; Apply 1 Application topically 2 (two) times daily. Apply to rash of genitals and left foot  Tinea pedis of left foot -     Clotrimazole; Apply 1 Application topically 2 (two) times daily. Apply to rash of genitals and left foot  NGU (nongonococcal urethritis) Assessment & Plan: Clear penile d/c, +WBC on gram stain. Will treat as NGU with doxycycline  100 mg BID x 7 days.   Orders: -     Doxycycline  Hyclate; Take 1 tablet (100 mg total) by mouth 2 (two) times daily for 7 days.   Patient does have STI symptoms Patient accepted the following screenings: penile gram stain for GC, urine CT/GC, HIV, and RPR Patient meets criteria for HepB screening? No. Ordered? not applicable Patient meets criteria for HepC screening? No. Ordered? not applicable Recommended condom use with all sex Discussed importance of condom use for STI prevention  Treat positive test results per standing order. Discussed time line for [facility] results and that patient will be called with positive results and encouraged patient to call if he had not heard in 2 weeks Recommended repeat testing in 3 months with positive results. Recommended returning for continued or worsening symptoms.   No follow-ups on file.  No future appointments.  Betsey CHRISTELLA Helling, MD

## 2023-10-26 LAB — CHLAMYDIA/GC NAA, CONFIRMATION
Chlamydia trachomatis, NAA: NEGATIVE
Neisseria gonorrhoeae, NAA: NEGATIVE

## 2023-10-26 LAB — SPECIMEN STATUS REPORT

## 2023-10-29 ENCOUNTER — Ambulatory Visit: Payer: Self-pay | Admitting: Family Medicine

## 2023-10-29 NOTE — Progress Notes (Signed)
 G/C urine negative. No further tx indicated.   Dorothyann Helling, MD 10/29/23  2:56 PM

## 2023-11-05 ENCOUNTER — Encounter: Payer: Self-pay | Admitting: Family Medicine

## 2023-12-03 NOTE — Addendum Note (Signed)
 Addended by: WONDA ROGUE on: 12/03/2023 02:06 PM   Modules accepted: Orders
